# Patient Record
Sex: Male | Born: 1975 | Race: White | Hispanic: No | Marital: Married | Smoking: Former smoker
Health system: Southern US, Community
[De-identification: ages and names within clinical notes are randomized; demographics above are authoritative.]

## PROBLEM LIST (undated history)

## (undated) DIAGNOSIS — K2970 Gastritis, unspecified, without bleeding: Secondary | ICD-10-CM

## (undated) DIAGNOSIS — F419 Anxiety disorder, unspecified: Secondary | ICD-10-CM

## (undated) DIAGNOSIS — K3184 Gastroparesis: Secondary | ICD-10-CM

## (undated) DIAGNOSIS — B9681 Helicobacter pylori [H. pylori] as the cause of diseases classified elsewhere: Secondary | ICD-10-CM

## (undated) DIAGNOSIS — M1990 Unspecified osteoarthritis, unspecified site: Secondary | ICD-10-CM

## (undated) DIAGNOSIS — G43909 Migraine, unspecified, not intractable, without status migrainosus: Secondary | ICD-10-CM

## (undated) DIAGNOSIS — T7840XA Allergy, unspecified, initial encounter: Secondary | ICD-10-CM

## (undated) DIAGNOSIS — F952 Tourette's disorder: Secondary | ICD-10-CM

## (undated) DIAGNOSIS — K648 Other hemorrhoids: Secondary | ICD-10-CM

## (undated) HISTORY — DX: Migraine, unspecified, not intractable, without status migrainosus: G43.909

## (undated) HISTORY — DX: Tourette's disorder: F95.2

## (undated) HISTORY — DX: Helicobacter pylori (H. pylori) as the cause of diseases classified elsewhere: B96.81

## (undated) HISTORY — DX: Other hemorrhoids: K64.8

## (undated) HISTORY — DX: Gastritis, unspecified, without bleeding: K29.70

## (undated) HISTORY — DX: Allergy, unspecified, initial encounter: T78.40XA

## (undated) HISTORY — DX: Unspecified osteoarthritis, unspecified site: M19.90

## (undated) HISTORY — DX: Anxiety disorder, unspecified: F41.9

---

## 2009-11-03 HISTORY — PX: KNEE ARTHROSCOPY: SUR90

## 2010-11-25 ENCOUNTER — Ambulatory Visit
Admission: RE | Admit: 2010-11-25 | Discharge: 2010-11-25 | Payer: Self-pay | Source: Home / Self Care | Attending: Specialist | Admitting: Specialist

## 2010-11-26 LAB — POCT HEMOGLOBIN-HEMACUE: Hemoglobin: 14.7 g/dL (ref 13.0–17.0)

## 2010-11-26 NOTE — Op Note (Signed)
Shane Curry, Shane Curry              ACCOUNT NO.:  [id number]  MEDICAL RECORD NO.:  [id number]          PATIENT TYPE:  AMB  LOCATION:  [facility]                         FACILITY:  [facility]  PHYSICIAN:  Jene Every, M.D.    DATE OF BIRTH:  08-28-1976  DATE OF PROCEDURE: DATE OF DISCHARGE:                              OPERATIVE REPORT   PREOPERATIVE DIAGNOSES:  Medial meniscus tear of the left knee, degenerative joint disease, Baker's cyst.  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  PROCEDURE PERFORMED: 1. Left knee arthroscopy. 2. Partial medial meniscectomy. 3. Chondroplasty of the medial femoral condyle. 4. Drainage of Baker's cyst.  ANESTHESIA:  General.  ASSISTANT:  No assistant.  BRIEF HISTORY:  35 year old, locking, popping, giving way, left knee secondary to repetitive squatting.  MRI indicated meniscus tear and Baker cyst, indicated for partial meniscectomy.  He is also wondering whether the cyst can be drained.  I indicated typically when the pathology is addressed, the Baker cyst would resolve, but we would probe the area of the back of the meniscus in an effort to drain any fluid from the communication.  Risks and benefits were discussed, including bleeding, infection, damage to surrounding structures, no changes in symptoms, worsening symptoms, need for repeat debridement, DVT, PE, anesthetic complications, etc.  TECHNIQUE:  The patient was placed in supine position.  After adequate general anesthesia and 1 gram of vancomycin, the left lower extremity was prepped and draped in the usual sterile fashion.  A lateral parapatellar portal and a superomedial parapatellar portal were fashioned with a #11 blade.  Ingress cannula was atraumatically placed. Irrigant was utilized to insufflate the joint.  Under direct visualization, a medial parapatellar  portal was fashioned with a #11 blade after localization with an 18 gauge needle, sparing the medial meniscus.  There was extensive tearing of the posterior third of the medial meniscus.  It was unstable to probe palpation.  There were grade 3 changes of the medial femoral condyle posteriorly and toward the notch.  A basket rongeur was introduced to form a partial medial meniscectomy to a stable base and further contoured with 3.5 Cuda shaver from both portals.  Remnant of the meniscus was stable to probe palpation.  Therefore, I took a neural probe beneath the meniscus right where the tear was back into the posterior capsule and probed that and palpated the posterior fossa and expressed fluid out through that region.  Next, we viewed the intercondylar notch, normal ACL and PCL.  The lateral compartment revealed normal femoral condyle, tibial plateau, and meniscus stable to probe palpation.  The suprapatellar pouch revealed normal patellofemoral tracking. Gutters were unremarkable.  No chondromalacia.  I reexamined the medial compartment.  No further pathology amenable to arthroscopic intervention; therefore, all instrumentation was removed. Portals were closed with 4-0 nylon simple sutures.  The joint was infiltrated with 0.25% Marcaine with epinephrine.  The  wound was dressed sterilely, awoken without difficulty and transported to the recovery room in satisfactory condition.  The patient tolerated the procedure well with no  complication and minimal blood loss.     Jene Every, M.D.     Cordelia Pen  D:  11/25/2010  T:  11/25/2010  Job:  [id number]  Electronically Signed by Jene Every M.D. on 11/26/2010 12:18:06 PM

## 2012-08-06 ENCOUNTER — Ambulatory Visit (INDEPENDENT_AMBULATORY_CARE_PROVIDER_SITE_OTHER): Payer: BC Managed Care – PPO | Admitting: Internal Medicine

## 2012-08-06 ENCOUNTER — Ambulatory Visit
Admission: RE | Admit: 2012-08-06 | Discharge: 2012-08-06 | Disposition: A | Discharge status: Home / Self Care | Payer: BC Managed Care – PPO | Source: Ambulatory Visit | Attending: Internal Medicine | Admitting: Internal Medicine

## 2012-08-06 ENCOUNTER — Ambulatory Visit: Payer: BC Managed Care – PPO

## 2012-08-06 VITALS — BP 136/103 | HR 67 | Temp 98.0°F | Resp 18 | Ht 69.5 in | Wt 151.0 lb

## 2012-08-06 DIAGNOSIS — F952 Tourette's disorder: Secondary | ICD-10-CM | POA: Insufficient documentation

## 2012-08-06 DIAGNOSIS — K5900 Constipation, unspecified: Secondary | ICD-10-CM

## 2012-08-06 DIAGNOSIS — R1011 Right upper quadrant pain: Secondary | ICD-10-CM

## 2012-08-06 DIAGNOSIS — R630 Anorexia: Secondary | ICD-10-CM

## 2012-08-06 DIAGNOSIS — G8929 Other chronic pain: Secondary | ICD-10-CM

## 2012-08-06 DIAGNOSIS — R109 Unspecified abdominal pain: Secondary | ICD-10-CM

## 2012-08-06 LAB — POCT CBC
Hemoglobin: 16.2 g/dL (ref 14.1–18.1)
MCH, POC: 28.8 pg (ref 27–31.2)
MPV: 11.6 fL (ref 0–99.8)
POC MID %: 5.8 %M (ref 0–12)
RBC: 5.62 M/uL (ref 4.69–6.13)
WBC: 7.5 10*3/uL (ref 4.6–10.2)

## 2012-08-06 LAB — POCT URINALYSIS DIPSTICK
Ketones, UA: NEGATIVE
Leukocytes, UA: NEGATIVE
Nitrite, UA: NEGATIVE
Protein, UA: NEGATIVE
Urobilinogen, UA: 0.2

## 2012-08-06 LAB — COMPREHENSIVE METABOLIC PANEL
ALT: 12 U/L (ref 0–53)
CO2: 25 mEq/L (ref 19–32)
Calcium: 9.8 mg/dL (ref 8.4–10.5)
Chloride: 106 mEq/L (ref 96–112)
Potassium: 5.4 mEq/L — ABNORMAL HIGH (ref 3.5–5.3)
Sodium: 142 mEq/L (ref 135–145)
Total Protein: 7.7 g/dL (ref 6.0–8.3)

## 2012-08-06 LAB — TSH: TSH: 1.654 u[IU]/mL (ref 0.350–4.500)

## 2012-08-06 LAB — POCT UA - MICROSCOPIC ONLY
Mucus, UA: POSITIVE
Yeast, UA: NEGATIVE

## 2012-08-06 MED ORDER — IOHEXOL 300 MG/ML  SOLN
100.0000 mL | Freq: Once | INTRAMUSCULAR | Status: AC | PRN
  Administered 2012-08-06: 100 mL via INTRAVENOUS

## 2012-08-06 NOTE — Progress Notes (Signed)
  Subjective:    Patient ID: Shane Curry, male    DOB: Nov 10, 1975, 36 y.o.   MRN: [id number]  HPI Has chronic constipation, lately a change occurs with decreased appetitie,  Nausea and increased bloating. Used mom and had diarrhea/loose stools for days. Is on 2mg /day of alprazolam for years prescribed by neurologist 10 years  Ago for Tourretes After trying many powerful cns meds. He knows he is dependent on alprazolam. No weight loss. Has nite sweats and chills with no fever. Has felt bad GI wise over 2 yrs.   Review of Systems See above     Objective:   Physical Exam   Results for orders placed in visit on 08/06/12  POCT CBC      Component Value Range   WBC 7.5  4.6 - 10.2 K/uL   Lymph, poc 1.8  0.6 - 3.4   POC LYMPH PERCENT 24.3  10 - 50 %L   MID (cbc) 0.4  0 - 0.9   POC MID % 5.8  0 - 12 %M   POC Granulocyte 5.2  2 - 6.9   Granulocyte percent 69.9  37 - 80 %G   RBC 5.62  4.69 - 6.13 M/uL   Hemoglobin 16.2  14.1 - 18.1 g/dL   HCT, POC 78.2  95.6 - 53.7 %   MCV 90.2  80 - 97 fL   MCH, POC 28.8  27 - 31.2 pg   MCHC 32.0  31.8 - 35.4 g/dL   RDW, POC 21.3     Platelet Count, POC 231  142 - 424 K/uL   MPV 11.6  0 - 99.8 fL  IFOBT (OCCULT BLOOD)      Component Value Range   IFOBT Positive    POCT URINALYSIS DIPSTICK      Component Value Range   Color, UA dark yellow     Clarity, UA clear     Glucose, UA negative     Bilirubin, UA negative     Ketones, UA negative     Spec Grav, UA >=1.030     Blood, UA negative     pH, UA 5.0     Protein, UA negative     Urobilinogen, UA 0.2     Nitrite, UA negative     Leukocytes, UA Negative    POCT UA - MICROSCOPIC ONLY      Component Value Range   WBC, Ur, HPF, POC 1-2     RBC, urine, microscopic 1-5     Bacteria, U Microscopic negative     Mucus, UA positive     Epithelial cells, urine per micros 0-2     Crystals, Ur, HPF, POC negative     Casts, Ur, LPF, POC negative     Yeast, UA negative     [facility] reading  (PRIMARY) by  Dr.Guest no obstruction, no free air        Assessment & Plan:  Abdominal pain Hematuria, hem pos stool Chronic constipation RF for ct abdom with contrast Then refer to GI Anorexia Night sweats

## 2012-08-06 NOTE — Patient Instructions (Signed)

## 2012-08-08 ENCOUNTER — Encounter: Payer: Self-pay | Admitting: Internal Medicine

## 2012-08-08 ENCOUNTER — Ambulatory Visit (INDEPENDENT_AMBULATORY_CARE_PROVIDER_SITE_OTHER): Payer: BC Managed Care – PPO | Admitting: Internal Medicine

## 2012-08-08 VITALS — BP 136/86 | HR 46 | Temp 97.7°F | Resp 16

## 2012-08-08 DIAGNOSIS — R935 Abnormal findings on diagnostic imaging of other abdominal regions, including retroperitoneum: Secondary | ICD-10-CM

## 2012-08-08 DIAGNOSIS — G8929 Other chronic pain: Secondary | ICD-10-CM

## 2012-08-08 DIAGNOSIS — R1013 Epigastric pain: Secondary | ICD-10-CM

## 2012-08-08 DIAGNOSIS — R630 Anorexia: Secondary | ICD-10-CM

## 2012-08-08 DIAGNOSIS — K5900 Constipation, unspecified: Secondary | ICD-10-CM

## 2012-08-08 NOTE — Progress Notes (Signed)
  Subjective:    Patient ID: Shane Curry, male    DOB: Mar 14, 1976, 36 y.o.   MRN: [id number]  HPI CT abdomen/pelvis abnormal with mucosal edema suggestive of a form of colitis. All labs reviewed   Review of Systems     Objective:   Physical Exam        Assessment & Plan:  Will refer to GI in next few days

## 2012-08-09 ENCOUNTER — Encounter: Payer: Self-pay | Admitting: Internal Medicine

## 2012-08-10 ENCOUNTER — Telehealth: Payer: Self-pay | Admitting: Internal Medicine

## 2012-08-10 NOTE — Telephone Encounter (Signed)
Spoke with patient and moved OV to 08/13/12 at 2:45 PM.

## 2012-08-11 ENCOUNTER — Encounter: Payer: Self-pay | Admitting: Internal Medicine

## 2012-08-13 ENCOUNTER — Ambulatory Visit (INDEPENDENT_AMBULATORY_CARE_PROVIDER_SITE_OTHER): Payer: BC Managed Care – PPO | Admitting: Internal Medicine

## 2012-08-13 ENCOUNTER — Encounter: Payer: Self-pay | Admitting: Internal Medicine

## 2012-08-13 VITALS — BP 124/90 | HR 72 | Ht 69.5 in | Wt 151.8 lb

## 2012-08-13 DIAGNOSIS — K5900 Constipation, unspecified: Secondary | ICD-10-CM

## 2012-08-13 DIAGNOSIS — R933 Abnormal findings on diagnostic imaging of other parts of digestive tract: Secondary | ICD-10-CM

## 2012-08-13 DIAGNOSIS — R109 Unspecified abdominal pain: Secondary | ICD-10-CM

## 2012-08-13 MED ORDER — PEG-KCL-NACL-NASULF-NA ASC-C 100 G PO SOLR: 1.0000 | Freq: Once | ORAL | Status: DC

## 2012-08-13 MED ORDER — OXYCODONE-ACETAMINOPHEN 7.5-325 MG PO TABS: 1.0000 | ORAL_TABLET | Freq: Four times a day (QID) | ORAL | Status: DC | PRN

## 2012-08-13 MED ORDER — ONDANSETRON 4 MG PO TBDP: 4.0000 mg | ORAL_TABLET | Freq: Three times a day (TID) | ORAL | Status: DC | PRN

## 2012-08-13 NOTE — Patient Instructions (Addendum)
You have been scheduled for a colonoscopy with propofol. Please follow written instructions given to you at your visit today.  Please pick up your prep kit at the pharmacy within the next 1-3 days. If you use inhalers (even only as needed), please bring them with you on the day of your procedure.   We have sent the following medications to your pharmacy for you to pick up at your convenience: Percocet, zofran moviprep  Differential diagnosis: colon ischemia,  Or IBD  Take Miralax 2-4 times daily until you start to prep for your procedure.

## 2012-08-13 NOTE — Progress Notes (Signed)
Patient ID: Shane Curry, male   DOB: [birth year], 36 y.o.   MRN: [id number]  SUBJECTIVE: HPI Shane Curry is a 36 year old male with a past medical history of Tourette's syndrome who seen in consultation at the request of Dr. Perrin Maltese for evaluation of abnormal GI imaging, abdominal pain with constipation.  The patient is alone today.  The patient reports an over two-year history of chronic constipation, but 3-4 weeks of mild right-sided abdominal pain.  This has also been associated with severe constipation, poor appetite, and poor sleep. This prompted him to seek evaluation about 6 days ago for pain after his abdominal pain acutely worsened.  He now describes middle and right sided abdominal pain which is severe, rated 7-8/10 at worst.  He thought initially this was due to constipation and about 8 days ago took a dose of milk of magnesia and after this had one to 2 days of very loose stool. No significant bowel movement since then. He did start MiraLAX about 4 days ago 17 g daily and yesterday and today had a small, thin brown stool.  He does report occasional blood in his stool over the last 2 years. He describes this as bright red and worse when his stools are difficult to pass. He does occasionally feel perianal burning with passage of stool. He denies fevers, but endorses chills and at times diaphoresis when the pain is severe. His weight has been stable. He denies heartburn, but has had nausea associated with this pain and constipation. He's also vomited on multiple occasions over the last several weeks. He denies hematemesis. He's been out of work for at least a week do to the symptoms. He remains very concerned about not working and be able to support his family.  Review of Systems  As per history of present illness, otherwise negative   Past Medical History  Diagnosis Date    Tourette's syndrome    . Anxiety     Current Outpatient Prescriptions  Medication Sig Dispense Refill  . alprazolam  (XANAX) 2 MG tablet Take 2 mg by mouth at bedtime as needed.      . triazolam (HALCION) 0.25 MG tablet Take 0.25 mg by mouth at bedtime as needed.      . ondansetron (ZOFRAN ODT) 4 MG disintegrating tablet Take 1 tablet (4 mg total) by mouth every 8 (eight) hours as needed for nausea.  20 tablet  0  . oxyCODONE-acetaminophen (PERCOCET) 7.5-325 MG per tablet Take 1 tablet by mouth every 6 (six) hours as needed for pain.  60 tablet  0  . peg 3350 powder (MOVIPREP) 100 G SOLR Take 1 kit (100 g total) by mouth once.  1 kit  0    Allergies  Allergen Reactions  . Penicillins     Family History  Problem Relation Age of Onset  . Diabetes Father   . Diabetes Paternal Grandfather   . Diabetes Maternal Grandfather     History  Substance Use Topics  . Smoking status: Former Games developer  . Smokeless tobacco: Never Used  . Alcohol Use: No     1 drink yearly    OBJECTIVE: BP 124/90  Pulse 72  Ht 5' 9.5" (1.765 m)  Wt 151 lb 12.8 oz (68.856 kg)  BMI 22.10 kg/m2 Constitutional: Well-developed and well-nourished. No distress. HEENT: Normocephalic and atraumatic. Oropharynx is clear and moist. No oropharyngeal exudate. Conjunctivae are normal. No scleral icterus. Neck: Neck supple. Trachea midline. Cardiovascular: Normal rate, regular rhythm and intact distal pulses.  No M/R/G Pulmonary/chest: Effort normal and breath sounds normal. No wheezing, rales or rhonchi. Abdominal: Soft, moderate tenderness mid and right abdomen and lower abdomen with mild voluntary guarding but no rebound, nondistended. Bowel sounds active throughout. There are no masses palpable. No hepatosplenomegaly. Extremities: no clubbing, cyanosis, or edema Lymphadenopathy: No cervical adenopathy noted. Neurological: Alert and oriented to person place and time. Skin: Skin is warm and dry. No rashes noted. Psychiatric: Normal mood and affect. Behavior is normal.  Labs and Imaging -- CBC    Component Value Date/Time   WBC 7.5  08/06/2012 1056   RBC 5.62 08/06/2012 1056   HGB 16.2 08/06/2012 1056   HGB 14.7 11/25/2010 1138   HCT 50.7 08/06/2012 1056   MCV 90.2 08/06/2012 1056   MCH 28.8 08/06/2012 1056   MCHC 32.0 08/06/2012 1056    CMP     Component Value Date/Time   NA 142 08/06/2012 1046   K 5.4* 08/06/2012 1046   CL 106 08/06/2012 1046   CO2 25 08/06/2012 1046   GLUCOSE 104* 08/06/2012 1046   BUN 16 08/06/2012 1046   CREATININE 1.03 08/06/2012 1046   CALCIUM 9.8 08/06/2012 1046   PROT 7.7 08/06/2012 1046   ALBUMIN 5.0 08/06/2012 1046   AST 13 08/06/2012 1046   ALT 12 08/06/2012 1046   ALKPHOS 48 08/06/2012 1046   BILITOT 0.9 08/06/2012 1046   CT performed 08/06/2012  Clinical Data: Right upper quadrant pain, worse over last 2 weeks, some nausea and vomiting, chronic constipation   CT ABDOMEN AND PELVIS WITH CONTRAST   Technique:  Multidetector CT imaging of the abdomen and pelvis was performed following the standard protocol during bolus administration of intravenous contrast.   Contrast: OMNIPAQUE IOHEXOL 300 MG/ML  SOLN   Comparison: None.   Findings: The lung bases are clear.  The liver enhances with no focal abnormality and no ductal dilatation is seen.  No calcified gallstones are noted.  The pancreas is normal in size and the pancreatic duct is not dilated.  The adrenal glands and spleen are unremarkable.  The stomach is moderately fluid distended with no significant abnormality noted.  The kidneys enhance with no calculus or mass and no hydronephrosis is seen.  The abdominal aorta is normal in caliber.  The mesenteric vasculature is patent. No adenopathy is seen.   The urinary bladder is moderately distended with no abnormality noted.  The prostate is normal in size.   However, there is apparent mucosal edema of bowel in the right abdomen.  This appears to represent the proximal transverse colon and hepatic flexure of colon and is worrisome for focal colitis. The terminal ileum is well  visualized and opacifies with contrast, with no abnormality noted.  The appendix fills with air normally. No bony abnormality is seen.   IMPRESSION:   1.  Although the colon is not well distended there is suspicion of mucosal edema involving what appears to be the proximal transverse colon and hepatic flexure of colon.  This is most consistent with focal colitis.  Clinical correlation is recommended. 2.  The appendix and terminal ileum are unremarkable.    ASSESSMENT AND PLAN: 36 year old male with a past medical history of Tourette's syndrome who seen in consultation at the request of Dr. Perrin Maltese for evaluation of abnormal GI imaging, abdominal pain with constipation.   1.  Severe abdominal pain/abnormal GI imaging/constipation -- the patient's symptoms are moderate to severe, but at this point he does not appear toxic and I  do not think he needs an admission. He is also adamant that he does not want to be in hospital right now. We discussed the differential which includes ischemic, inflammatory such as Crohn's disease, and less likely infectious or malignant.  I think at this point he needs colonoscopy to examine the colon directly, particularly at the hepatic flexure and proximal transverse colon (the area inflamed at CT).  We will try to fit him in for colonoscopy next week, but in the interim I would like him to increase the dose of MiraLAX. He will take an extra dose today and then take 2-4 doses on a daily basis up until he begins his colonoscopy prep. I've given him oxycodone/Tylenol 1-2 tablets every 6 hours when necessary pain along with ondansetron to be used as needed and as directed for nausea. I've also recommended a low residue diet until we have figured out what is going on. He is instructed to call us immediately if he develops worsening pain, fever, severe nausea and vomiting, bloody stools and he voices understanding. He is given my business card and number to call should he need  it.   --He is asked for paperwork to be filled out for short-term disability which I think is appropriate given his current situation. Medical records will start this process and I will sign the paperwork when it is available for me.

## 2012-08-16 ENCOUNTER — Encounter: Payer: Self-pay | Admitting: Internal Medicine

## 2012-08-18 ENCOUNTER — Encounter: Payer: Self-pay | Admitting: Internal Medicine

## 2012-08-18 ENCOUNTER — Ambulatory Visit (AMBULATORY_SURGERY_CENTER): Payer: BC Managed Care – PPO | Admitting: Internal Medicine

## 2012-08-18 VITALS — BP 129/75 | HR 77 | Temp 97.1°F | Resp 19 | Ht 69.5 in | Wt 151.0 lb

## 2012-08-18 DIAGNOSIS — R109 Unspecified abdominal pain: Secondary | ICD-10-CM

## 2012-08-18 DIAGNOSIS — R933 Abnormal findings on diagnostic imaging of other parts of digestive tract: Secondary | ICD-10-CM

## 2012-08-18 DIAGNOSIS — K5900 Constipation, unspecified: Secondary | ICD-10-CM

## 2012-08-18 MED ORDER — HYOSCYAMINE SULFATE 0.125 MG SL SUBL: 0.1250 mg | SUBLINGUAL_TABLET | SUBLINGUAL | Status: DC | PRN

## 2012-08-18 MED ORDER — SODIUM CHLORIDE 0.9 % IV SOLN: 500.0000 mL | INTRAVENOUS | Status: DC

## 2012-08-18 MED ORDER — LINACLOTIDE 290 MCG PO CAPS: 290.0000 ug | ORAL_CAPSULE | Freq: Every day | ORAL | Status: DC

## 2012-08-18 NOTE — Progress Notes (Signed)
Patient did not experience any of the following events: a burn prior to discharge; a fall within the facility; wrong site/side/patient/procedure/implant event; or a hospital transfer or hospital admission upon discharge from the facility. (G8907) Patient did not have preoperative order for IV antibiotic SSI prophylaxis. (G8918)  

## 2012-08-18 NOTE — Op Note (Signed)
[facility] [address] [place], [address]   COLONOSCOPY PROCEDURE REPORT  PATIENT: Shane Curry, Shane Curry  MR#: [id number] BIRTHDATE: Feb 16, 1976 , 36  yrs. old GENDER: Male ENDOSCOPIST: Beverley Fiedler, MD REFERRED ON:GEXBM Guest, M.D. PROCEDURE DATE:  08/18/2012 PROCEDURE:   Colonoscopy, diagnostic ASA CLASS:   Class I INDICATIONS:an abnormal CT, constipation, abdominal pain in right abdomen. MEDICATIONS: MAC sedation, administered by CRNA and propofol (Diprivan) 400mg  IV  DESCRIPTION OF PROCEDURE:   After the risks benefits and alternatives of the procedure were thoroughly explained, informed consent was obtained.  A digital rectal exam revealed no rectal mass.   The LB CF-[id number]  endoscope was introduced through the anus and advanced to the terminal ileum which was intubated for a short distance. No adverse events experienced.   The quality of the prep was poor, using MoviPrep  The instrument was then slowly withdrawn as the colon was fully examined.      COLON FINDINGS: The mucosa appeared normal in the terminal ileum. A significant amount of liquid stool was present throughout the entire examined colon.  Copious Irrigation and lavage was performed using a large amount of water.  Partial clearance with improved visualization was achieved, adequate to detect inflammation.   The colonic mucosa appeared normal throughout the entire examined colon.  Retroflexed views revealed no abnormalities. The time to cecum=4 minutes 05 seconds.  Withdrawal time=14 minutes 11 seconds. The scope was withdrawn and the procedure completed.    COMPLICATIONS: There were no complications.  ENDOSCOPIC IMPRESSION: 1.   Normal mucosa in the terminal ileum 2.   Significant amount of stool was present throughout the entire examined colon, irrigation and lavage performed 3.   The colonic mucosa appeared normal throughout the entire examined colon  RECOMMENDATIONS: 1.   Advance diet as tolerated. 2.  Trial of Linzess 290 mcg once daily for constipation 3.  If pain fails to improve, likely will repeat CT scan abd/pelvis 4.  Return to office in about 2 weeks   eSigned:  Beverley Fiedler, MD 08/18/2012 8:38 AM   cc: Robert Bellow, MD and The Patient   PATIENT NAME:  Shane Curry, Shane Curry MR#: [id number]

## 2012-08-18 NOTE — Patient Instructions (Addendum)
Advance your diet as tolerated. Trial of Linzess 290 mcg once daily for constipation. Return to Dr. Lauro Franklin office in 2 weeks, call for appointment, [phone].    YOU HAD AN ENDOSCOPIC PROCEDURE TODAY AT THE [facility]: Refer to the procedure report that was given to you for any specific questions about what was found during the examination.  If the procedure report does not answer your questions, please call your gastroenterologist to clarify.  If you requested that your care partner not be given the details of your procedure findings, then the procedure report has been included in a sealed envelope for you to review at your convenience later.  YOU SHOULD EXPECT: Some feelings of bloating in the abdomen. Passage of more gas than usual.  Walking can help get rid of the air that was put into your GI tract during the procedure and reduce the bloating. If you had a lower endoscopy (such as a colonoscopy or flexible sigmoidoscopy) you may notice spotting of blood in your stool or on the toilet paper. If you underwent a bowel prep for your procedure, then you may not have a normal bowel movement for a few days.  DIET: Your first meal following the procedure should be a light meal and then it is ok to progress to your normal diet.  A half-sandwich or bowl of soup is an example of a good first meal.  Heavy or fried foods are harder to digest and may make you feel nauseous or bloated.  Likewise meals heavy in dairy and vegetables can cause extra gas to form and this can also increase the bloating.  Drink plenty of fluids but you should avoid alcoholic beverages for 24 hours.  ACTIVITY: Your care partner should take you home directly after the procedure.  You should plan to take it easy, moving slowly for the rest of the day.  You can resume normal activity the day after the procedure however you should NOT DRIVE or use heavy machinery for 24 hours (because of the sedation medicines used during  the test).    SYMPTOMS TO REPORT IMMEDIATELY: A gastroenterologist can be reached at any hour.  During normal business hours, 8:30 AM to 5:00 PM Monday through Friday, call [phone].  After hours and on weekends, please call the GI answering service at [phone] who will take a message and have the physician on call contact you.   Following lower endoscopy (colonoscopy or flexible sigmoidoscopy):  Excessive amounts of blood in the stool  Significant tenderness or worsening of abdominal pains  Swelling of the abdomen that is new, acute  Fever of 100F or higher  FOLLOW UP: If any biopsies were taken you will be contacted by phone or by letter within the next 1-3 weeks.  Call your gastroenterologist if you have not heard about the biopsies in 3 weeks.  Our staff will call the home number listed on your records the next business day following your procedure to check on you and address any questions or concerns that you may have at that time regarding the information given to you following your procedure. This is a courtesy call and so if there is no answer at the home number and we have not heard from you through the emergency physician on call, we will assume that you have returned to your regular daily activities without incident.  SIGNATURES/CONFIDENTIALITY: You and/or your care partner have signed paperwork which will be entered into your electronic medical record.  These  signatures attest to the fact that that the information above on your After Visit Summary has been reviewed and is understood.  Full responsibility of the confidentiality of this discharge information lies with you and/or your care-partner.

## 2012-08-19 ENCOUNTER — Telehealth: Payer: Self-pay | Admitting: *Deleted

## 2012-08-19 NOTE — Telephone Encounter (Signed)
Left message that called for f/u 

## 2012-08-24 ENCOUNTER — Telehealth: Payer: Self-pay | Admitting: *Deleted

## 2012-08-24 ENCOUNTER — Ambulatory Visit: Payer: BC Managed Care – PPO | Admitting: Internal Medicine

## 2012-08-24 DIAGNOSIS — R109 Unspecified abdominal pain: Secondary | ICD-10-CM

## 2012-08-24 DIAGNOSIS — R935 Abnormal findings on diagnostic imaging of other abdominal regions, including retroperitoneum: Secondary | ICD-10-CM

## 2012-08-24 NOTE — Telephone Encounter (Signed)
Called pt for an update and to schedule him for a f/u which he made this am; I had made one for 09/02/12. Pt reports the same problems as mentioned in the 1st OV; constipation, bloating, cramping and his abdomen gurgling. He states the gurgling keeps him from sleeping. After the prep and the procedure, pt reports one formed stool that night. Since then, 08/18/12, he has taken Linzess 290 mcg every am; the only results have been a small amount of liquid. Yesterday, he took [employer] or MOM - I'm not sure- and had what he calls good output and he thinks he got cleaned out. His appetite remains decreased, eating 1/2 of what he normally would eat. He is trying to hydrate himself with water, white grape juice and Coke. This am, he took 2 Linzess and as of now, no BM.  Pt has an appt on Friday, 08/27/12 and I offered him 4pm today or an appt with Mike Gip, PA, but he can't get off work an other day and he only wants to see Dr Rhea Belton. Please advise of any orders or recommendations. Thanks.

## 2012-08-24 NOTE — Telephone Encounter (Signed)
Given persistent pain, history of abnormal imaging, I would repeat the CT scan of the abdomen and pelvis with contrast, indication persistent right-sided abdominal pain Also please repeat labs CBC, CMP, lipase, amylase, ESR

## 2012-08-25 ENCOUNTER — Other Ambulatory Visit (INDEPENDENT_AMBULATORY_CARE_PROVIDER_SITE_OTHER): Payer: BC Managed Care – PPO

## 2012-08-25 ENCOUNTER — Encounter: Payer: Self-pay | Admitting: Internal Medicine

## 2012-08-25 DIAGNOSIS — R109 Unspecified abdominal pain: Secondary | ICD-10-CM

## 2012-08-25 DIAGNOSIS — R935 Abnormal findings on diagnostic imaging of other abdominal regions, including retroperitoneum: Secondary | ICD-10-CM

## 2012-08-25 LAB — CBC WITH DIFFERENTIAL/PLATELET
Basophils Absolute: 0 10*3/uL (ref 0.0–0.1)
Eosinophils Absolute: 0.1 10*3/uL (ref 0.0–0.7)
Lymphocytes Relative: 22.4 % (ref 12.0–46.0)
MCHC: 32.9 g/dL (ref 30.0–36.0)
MCV: 88.3 fl (ref 78.0–100.0)
Monocytes Absolute: 0.5 10*3/uL (ref 0.1–1.0)
Neutro Abs: 3.5 10*3/uL (ref 1.4–7.7)
Neutrophils Relative %: 66.9 % (ref 43.0–77.0)
RDW: 12.8 % (ref 11.5–14.6)

## 2012-08-25 LAB — COMPREHENSIVE METABOLIC PANEL
ALT: 18 U/L (ref 0–53)
AST: 16 U/L (ref 0–37)
Alkaline Phosphatase: 42 U/L (ref 39–117)
Calcium: 9 mg/dL (ref 8.4–10.5)
Chloride: 105 mEq/L (ref 96–112)
Creatinine, Ser: 1 mg/dL (ref 0.4–1.5)
Potassium: 4.1 mEq/L (ref 3.5–5.1)

## 2012-08-25 LAB — LIPASE: Lipase: 42 U/L (ref 11.0–59.0)

## 2012-08-25 LAB — AMYLASE: Amylase: 106 U/L (ref 27–131)

## 2012-08-25 NOTE — Telephone Encounter (Signed)
Informed pt Dr Rhea Belton would like for him to have labs and repeat his CT Scan. He will go in the am for his CT scan and hopefully get labs today or tomorrow.

## 2012-08-26 ENCOUNTER — Ambulatory Visit (INDEPENDENT_AMBULATORY_CARE_PROVIDER_SITE_OTHER)
Admission: RE | Admit: 2012-08-26 | Discharge: 2012-08-26 | Disposition: A | Discharge status: Home / Self Care | Payer: BC Managed Care – PPO | Source: Ambulatory Visit | Attending: Internal Medicine | Admitting: Internal Medicine

## 2012-08-26 DIAGNOSIS — R935 Abnormal findings on diagnostic imaging of other abdominal regions, including retroperitoneum: Secondary | ICD-10-CM

## 2012-08-26 DIAGNOSIS — R109 Unspecified abdominal pain: Secondary | ICD-10-CM

## 2012-08-26 MED ORDER — IOHEXOL 300 MG/ML  SOLN
100.0000 mL | Freq: Once | INTRAMUSCULAR | Status: AC | PRN
  Administered 2012-08-26: 100 mL via INTRAVENOUS

## 2012-08-27 ENCOUNTER — Encounter: Payer: Self-pay | Admitting: Internal Medicine

## 2012-08-27 ENCOUNTER — Ambulatory Visit (INDEPENDENT_AMBULATORY_CARE_PROVIDER_SITE_OTHER): Payer: BC Managed Care – PPO | Admitting: Internal Medicine

## 2012-08-27 ENCOUNTER — Other Ambulatory Visit: Payer: Self-pay | Admitting: *Deleted

## 2012-08-27 VITALS — BP 124/80 | HR 68 | Ht 69.5 in | Wt 152.2 lb

## 2012-08-27 DIAGNOSIS — K5909 Other constipation: Secondary | ICD-10-CM

## 2012-08-27 DIAGNOSIS — R109 Unspecified abdominal pain: Secondary | ICD-10-CM

## 2012-08-27 DIAGNOSIS — R141 Gas pain: Secondary | ICD-10-CM

## 2012-08-27 DIAGNOSIS — K5900 Constipation, unspecified: Secondary | ICD-10-CM

## 2012-08-27 DIAGNOSIS — R140 Abdominal distension (gaseous): Secondary | ICD-10-CM

## 2012-08-27 MED ORDER — METRONIDAZOLE 250 MG PO TABS: 250.0000 mg | ORAL_TABLET | Freq: Three times a day (TID) | ORAL | Status: DC

## 2012-08-27 MED ORDER — HYOSCYAMINE SULFATE 0.125 MG SL SUBL: 0.1250 mg | SUBLINGUAL_TABLET | SUBLINGUAL | Status: DC | PRN

## 2012-08-27 MED ORDER — ALIGN PO CAPS: 1.0000 | ORAL_CAPSULE | Freq: Every day | ORAL | Status: DC

## 2012-08-27 MED ORDER — LINACLOTIDE 290 MCG PO CAPS: 290.0000 ug | ORAL_CAPSULE | Freq: Every day | ORAL | Status: DC

## 2012-08-27 NOTE — Patient Instructions (Addendum)
We have sent the following medications to your pharmacy for you to pick up at your convenience: Linzess, align, flagyl, please take all medications as directed.

## 2012-08-27 NOTE — Progress Notes (Signed)
Subjective:    Patient ID: Shane Curry, male    DOB: 24-May-1976, 36 y.o.   MRN: [id number]  HPI Shane Curry is a 36 yo male with PMH of Tourette's syndrome who seen in followup for evaluation of abdominal pain constipation.  I met the patient recently after being sent from primary care for evaluation of constipation, abdominal pain, and abnormal CT scan. He underwent colonoscopy which was performed on 08/18/2012. This exam revealed normal terminal ileum and normal colonic mucosa, though the preparation was poor. The mucosa was washed which revealed adequate views to exclude overwhelming inflammation. The patient was then given a prescription for [facility] to be used on a daily basis for constipation and a prescription for Levsin to be used as needed for abdominal pain and spasm. He did not get the latter filled.  He was also given a prescription for Percocet initially at clinic visit, but he threw this away because it wasn't helping and he read it causes constipation.  Since the colonoscopy he has still not had return of normal bowel function. He has had a few spontaneous bowel movements without laxatives but describes the stool as "mush". They are not frankly watery, but have no form. He continues to have abdominal bloating and abdominal pain. Most of the pain as cramping but it can be sharp. This is located primarily in the right abdomen that occasionally in the left as well. It does seem to be positional, because he describes last night when he was lying on the couch he felt well. Overall his pain is better, and he reports this "isn't my main concern". He reports no grade definite with a LINZESS, but a few days ago he took 2 doses of the 290 mcg pills and had watery stool. Overall his nausea is better and he's had no vomiting. No fevers or chills. He has still been unable to work. He has tried a very his diet to see how it impacts his symptoms. He reports having eaten pasta, chicken sandwich, [place]  food, [place] sandwich, a hamburger. He's also varied his fluid intake including Gatorade and coffee.    He does feel stress financially because he's been unable to work. He stated during the encounter that they're having to borrow money from his in-laws. 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  Review of Systems As per history of present illness, otherwise negative  Current Medications, Allergies, Past Medical History, Past Surgical History, Family History and Social History were reviewed in [employer] record.    Objective:   Physical Exam BP 124/80  Pulse 68  Ht 5' 9.5" (1.765 m)  Wt 152 lb 3.2 oz (69.037 kg)  BMI 22.15 kg/m2 Constitutional: Well-developed and well-nourished. No distress. HEENT: Normocephalic and atraumatic. Oropharynx is clear and moist. No oropharyngeal exudate. Conjunctivae are normal.  No scleral icterus. Neck: Neck supple. Trachea midline. Cardiovascular: Normal rate, regular rhythm and intact distal pulses. No M/R/G Pulmonary/chest: Effort normal and breath sounds normal. No wheezing, rales or rhonchi. Abdominal: Soft, nontender, nondistended. Bowel sounds active throughout. There are no masses palpable. No hepatosplenomegaly. Extremities: no clubbing, cyanosis, or edema Lymphadenopathy: No cervical adenopathy noted. Neurological: Alert and oriented to person place and time. Skin: Skin is warm and dry. No rashes noted. Psychiatric: Normal mood and affect. Behavior is normal.  CMP     Component Value Date/Time   NA 139 08/25/2012 1010  K 4.1 08/25/2012 1010   CL 105 08/25/2012 1010   CO2 28 08/25/2012 1010   GLUCOSE 87 08/25/2012 1010   BUN 15 08/25/2012 1010   CREATININE 1.0 08/25/2012 1010   CREATININE 1.03 08/06/2012 1046   CALCIUM 9.0 08/25/2012 1010   PROT 7.4 08/25/2012 1010   ALBUMIN 4.0 08/25/2012 1010   AST 16 08/25/2012 1010    ALT 18 08/25/2012 1010   ALKPHOS 42 08/25/2012 1010   BILITOT 0.9 08/25/2012 1010    CBC    Component Value Date/Time   WBC 5.3 08/25/2012 1010   WBC 7.5 08/06/2012 1056   RBC 4.82 08/25/2012 1010   RBC 5.62 08/06/2012 1056   HGB 14.0 08/25/2012 1010   HGB 16.2 08/06/2012 1056   HCT 42.6 08/25/2012 1010   HCT 50.7 08/06/2012 1056   PLT 157.0 08/25/2012 1010   MCV 88.3 08/25/2012 1010   MCV 90.2 08/06/2012 1056   MCH 28.8 08/06/2012 1056   MCHC 32.9 08/25/2012 1010   MCHC 32.0 08/06/2012 1056   RDW 12.8 08/25/2012 1010   LYMPHSABS 1.2 08/25/2012 1010   MONOABS 0.5 08/25/2012 1010   EOSABS 0.1 08/25/2012 1010   BASOSABS 0.0 08/25/2012 1010    Lipase     Component Value Date/Time   LIPASE 42.0 08/25/2012 1010    Amylase    Component Value Date/Time   AMYLASE 106 08/25/2012 1010    Repeat imaging done 08/25/2012 for persistent pain: Clinical Data: Right upper quadrant pain.  Nausea and vomiting. Chronic constipation.   CT ABDOMEN AND PELVIS WITH CONTRAST   Technique:  Multidetector CT imaging of the abdomen and pelvis was performed following the standard protocol during bolus administration of intravenous contrast.   Contrast: OMNIPAQUE IOHEXOL 300 MG/ML  SOLN   Comparison: August 06, 2012   Findings: The lung bases are clear.  The liver, spleen, pancreas, gallbladder, adrenal glands are normal.  There is a left parapelvic renal cyst.  The kidneys are normal.  The aorta is normal.  There is no abdominal lymphadenopathy.  There is no small bowel obstruction or diverticulitis.  The appendix is normal. The aorta is normal without aneurysmal dilatation.   Images of the pelvis demonstrate fluid filled bladder without gross abnormality.  Pelvic phleboliths are identified.  Images of the bones demonstrate minimal degenerative joint changes at L1-L2 with narrowed joint space and osteophyte formation.   IMPRESSION: No focal acute abnormality identified.  There is  no small bowel obstruction or diverticulitis.  The appendix is normal.  The gallbladder is normal.     Assessment & Plan:  36 yo male with PMH of Tourette's syndrome who seen in followup for evaluation of abdominal pain constipation.  1.  Abd pain/constipation/alerted bowel habits/bloating -- at this point he has had a thorough workup including lab evaluation, cross-sectional imaging, and direct visualization with colonoscopy with intubation of the terminal ileum. There was no evidence of colitis or ileitis, either infectious or inflammatory. Given the reassuring nature of the testing, at this point his symptoms seem primarily functional. I am going to empirically treat him with metronidazole 250 mg 3 times a day for 7 days for bacterial overgrowth. This would also treat an infectious etiology such as Giardia.  I would like him to begin a fiber supplement to help bulk his stools, specifically with Benefiber 1 tablespoon daily. I will also start him on Align as a probiotic, which hopefully will help regulate his stools and help with bloating. He will continue  to use LINZESS 290 mcg once daily only on a more as-needed basis if he feels particularly constipated. We have discussed that I do not feel he needs to have a bowel movement every day, and I do not want him to stress more if he doesn't. I also want him to avoid sitting on the toilet trying to have a stool, it just because he hasn't that day. I've also encouraged that he return to the pharmacy for Levsin prescription to be used as needed for cramping and spasm.  I will see him back in 2 weeks to reassess his symptoms, and I've advised he notify us sooner if he worsens in anyway. He voices understanding.

## 2012-08-30 ENCOUNTER — Telehealth: Payer: Self-pay | Admitting: *Deleted

## 2012-08-30 NOTE — Telephone Encounter (Signed)
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 lmom for pt to call back. We cannot give him any info on pt d/t HIPPA laws.

## 2012-09-02 ENCOUNTER — Ambulatory Visit: Payer: BC Managed Care – PPO | Admitting: Internal Medicine

## 2012-09-03 NOTE — Telephone Encounter (Signed)
Mr Shane Curry never called back.

## 2012-09-06 ENCOUNTER — Telehealth: Payer: Self-pay | Admitting: Internal Medicine

## 2012-09-06 NOTE — Telephone Encounter (Signed)
Pt here in the lobby when I went to place meds at the front desk.  He asked who called from his work and I informed him, Ree Kida. I went to get his disability form from Dr Rhea Belton, made a copy and gave him the original. Informed Shakeem that Mr Vela Prose called on 08/27/12, the same day as his last appt, and left a message to call him; I returned is call and left him a message and have not heard from him since. Pt stated understanding and left.

## 2012-11-18 ENCOUNTER — Ambulatory Visit (INDEPENDENT_AMBULATORY_CARE_PROVIDER_SITE_OTHER): Payer: BC Managed Care – PPO | Admitting: Emergency Medicine

## 2012-11-18 VITALS — BP 151/90 | HR 74 | Temp 98.0°F | Resp 18 | Ht 69.5 in | Wt 157.0 lb

## 2012-11-18 DIAGNOSIS — J209 Acute bronchitis, unspecified: Secondary | ICD-10-CM

## 2012-11-18 DIAGNOSIS — J018 Other acute sinusitis: Secondary | ICD-10-CM

## 2012-11-18 DIAGNOSIS — J111 Influenza due to unidentified influenza virus with other respiratory manifestations: Secondary | ICD-10-CM

## 2012-11-18 DIAGNOSIS — R6889 Other general symptoms and signs: Secondary | ICD-10-CM

## 2012-11-18 LAB — POCT INFLUENZA A/B
Influenza A, POC: NEGATIVE
Influenza B, POC: NEGATIVE

## 2012-11-18 MED ORDER — HYDROCOD POLST-CHLORPHEN POLST 10-8 MG/5ML PO LQCR: 5.0000 mL | Freq: Two times a day (BID) | ORAL | Status: DC | PRN

## 2012-11-18 MED ORDER — PSEUDOEPHEDRINE-GUAIFENESIN ER 60-600 MG PO TB12: 1.0000 | ORAL_TABLET | Freq: Two times a day (BID) | ORAL | Status: AC

## 2012-11-18 MED ORDER — LEVOFLOXACIN 500 MG PO TABS: 500.0000 mg | ORAL_TABLET | Freq: Every day | ORAL | Status: AC

## 2012-11-18 NOTE — Patient Instructions (Addendum)
Infectious Mononucleosis  Infectious mononucleosis (mono) is a common germ (viral) infection in children, teenagers, and young adults.   CAUSES   Mono is an infection caused by the Epstein Barr virus. The virus is spread by close personal contact with someone who has the infection. It can be passed by contact with your saliva through things such as kissing or sharing drinking glasses. Sometimes, the infection can be spread from someone who does not appear sick but still spreads the virus (asymptomatic carrier state).   SYMPTOMS   The most common symptoms of Mono are:   Sore throat.   Headache.   Fatigue.   Muscle aches.   Swollen glands.   Fever.   Poor appetite.   Enlarged liver or spleen.  The less common symptoms can include:   Rash.   Feeling sick to your stomach (nauseous).   Abdominal pain.  DIAGNOSIS   Mono is diagnosed by a blood test.   TREATMENT   Treatment of mono is usually at home. There is no medicine that cures this virus. Sometimes hospital treatment is needed in severe cases. Steroid medicine sometimes is needed if the swelling in the throat causes breathing or swallowing problems.   HOME CARE INSTRUCTIONS    Drink enough fluids to keep your urine clear or pale yellow.   Eat soft foods. Cool foods like popsicles or ice cream can soothe a sore throat.   Only take over-the-counter or prescription medicines for pain, discomfort, or fever as directed by your caregiver. Children under 18 years of age should not take aspirin.   Gargle salt water. This may help relieve your sore throat. Put 1 teaspoon (tsp) of salt in 1 cup of warm water. Sucking on hard candy may also help.   Rest as needed.   Start regular activities gradually after the fever is gone. Be sure to rest when tired.   Avoid strenuous exercise or contact sports until your caregiver says it is okay. The liver and spleen could be seriously injured.   Avoid sharing drinking glasses or kissing until your caregiver tells you  that you are no longer contagious.  SEEK MEDICAL CARE IF:    Your fever is not gone after 7 days.   Your activity level is not back to normal after 2 weeks.   You have yellow coloring to eyes and skin (jaundice).  SEEK IMMEDIATE MEDICAL CARE IF:    You have severe pain in the abdomen or shoulder.   You have trouble swallowing or drooling.   You have trouble breathing.   You develop a stiff neck.   You develop a severe headache.   You cannot stop throwing up (vomiting).   You have convulsions.   You are confused.   You have trouble with balance.   You develop signs of body fluid loss (dehydration):   Weakness.   Sunken eyes.   Pale skin.   Dry mouth.   Rapid breathing or pulse.  MAKE SURE YOU:    Understand these instructions.   Will watch your condition.   Will get help right away if you are not doing well or get worse.  Document Released: 10/17/2000 Document Revised: 01/12/2012 Document Reviewed: 08/15/2008  ExitCare Patient Information 2013 [employer].

## 2012-11-18 NOTE — Progress Notes (Signed)
[facility] [address] [place] [address] [phone]  Date:  11/18/2012   Name:  Shane Curry   DOB:  [birth year]   MRN:  [id number]  PCP:  Tally Due, MD    Chief Complaint: Cough and Sore Throat   History of Present Illness:  Shane Curry is a 37 y.o. very pleasant male patient who presents with the following:  Wife treated with two rounds of antibiotics for strep and has positive tests for mono.  He has had myalgias and arthralgias and yesterday developed sore throat.  No fever or chills.  Some non productive cough no wheezing or shortness of breath.  No nausea or vomiting.  Extensive sick contacts at work.  Patient Active Problem List  Diagnosis  . Chronic abdominal pain  . Tourette's syndrome  . Constipation    Past Medical History  Diagnosis Date  . Anxiety   . Allergy   . Arthritis     Past Surgical History  Procedure Date  . Knee arthroscopy 11/2009    History  Substance Use Topics  . Smoking status: Former Games developer  . Smokeless tobacco: Never Used  . Alcohol Use: Yes     Comment: 1 drink yearly    Family History  Problem Relation Age of Onset  . Diabetes Father   . Diabetes Paternal Grandfather   . Diabetes Maternal Grandfather   . Colon cancer Neg Hx   . Esophageal cancer Neg Hx   . Stomach cancer Neg Hx   . Rectal cancer Neg Hx     Allergies  Allergen Reactions  . Penicillins     unsure    Medication list has been reviewed and updated.  Current Outpatient Prescriptions on File Prior to Visit  Medication Sig Dispense Refill  . alprazolam (XANAX) 2 MG tablet Take 2 mg by mouth at bedtime as needed.      . cetirizine (ZYRTEC) 10 MG tablet Take 10 mg by mouth daily.      Marland Kitchen oxyCODONE-acetaminophen (PERCOCET) 7.5-325 MG per tablet Take 1 tablet by mouth every 6 (six) hours as needed for pain.  60 tablet  0  . triazolam (HALCION) 0.25 MG tablet Take 0.25 mg by mouth at bedtime as needed.      .  bifidobacterium infantis (ALIGN) capsule Take 1 capsule by mouth daily.  14 capsule  0  . hyoscyamine (LEVSIN SL) 0.125 MG SL tablet Place 1-2 tablets (0.125-0.25 mg total) under the tongue as needed for cramping (abdominal spasm/pain).  30 tablet  1  . Linaclotide (LINZESS) 290 MCG CAPS Take 290 mcg by mouth daily.  12 capsule  0  . metroNIDAZOLE (FLAGYL) 250 MG tablet Take 1 tablet (250 mg total) by mouth 3 (three) times daily.  30 tablet  0    Review of Systems:  As per HPI, otherwise negative.    Physical Examination: Filed Vitals:   11/18/12 0919  BP: 151/90  Pulse: 74  Temp: 98 F (36.7 C)  Resp: 18   Filed Vitals:   11/18/12 0919  Height: 5' 9.5" (1.765 m)  Weight: 157 lb (71.215 kg)   Body mass index is 22.85 kg/(m^2). Ideal Body Weight: Weight in (lb) to have BMI = 25: 171.4   GEN: WDWN, NAD, Non-toxic, A & O x 3 HEENT: Atraumatic, Normocephalic. Neck supple. No masses, No LAD. Ears and Nose: No external deformity.  Green nasal drainage CV: RRR, No M/G/R. No JVD. No thrill. No  extra heart sounds. PULM: CTA B, no wheezes, crackles, rhonchi. No retractions. No resp. distress. No accessory muscle use. ABD: S, NT, ND, +BS. No rebound. No HSM. EXTR: No c/c/e NEURO Normal gait.  PSYCH: Normally interactive. Conversant. Not depressed or anxious appearing.  Calm demeanor.    Assessment and Plan: Sinusitis Bronchitis levaquin mucinex tussionex  Carmelina Dane, MD  Results for orders placed in visit on 11/18/12  POCT INFLUENZA A/B      Component Value Range   Influenza A, POC Negative     Influenza B, POC Negative

## 2012-11-27 ENCOUNTER — Telehealth: Payer: Self-pay

## 2012-11-27 NOTE — Telephone Encounter (Signed)
PT STATES THAT HE WAS SEEN EARLIER THIS WEEK FOR FLU LIKE SYMPTOMS, AFTER TAKING THE ANTIBIOTIC THAT WAS PRESCRIBED HE BEGAN TO FEEL BETTER BUT NOW IS FEELING WORSE. PT COMPLAINS OF NAUSEA, RUNNY NOSE, COUGHING, AND DIARRHEA. PT WOULD LIKE TO KNOW IF HE COULD POSSIBLY GET A DIFFERENT ANTIBIOTIC CALLED INTO THE PHARMACY. PHARMACY: [facility]  BEST# [phone]

## 2012-11-29 NOTE — Telephone Encounter (Signed)
Sounds like he needs to be seen again. May now have the flu

## 2012-11-30 NOTE — Telephone Encounter (Signed)
Pt reported that he is feeling much better since he stopped the Abx. He stated that the nausea/diarrhea were SEs of the Abx and they have resolved.

## 2013-02-17 ENCOUNTER — Telehealth: Payer: Self-pay

## 2013-02-17 NOTE — Telephone Encounter (Signed)
Thanks, left message for him to advise, instructed if he has questions, he is to return to clinic.

## 2013-02-17 NOTE — Telephone Encounter (Signed)
We have not seen this patient for evaluation of his allergies, and most insurance companies will not pay for Singulair until failure of not only zyrtec and claritin, but also nasal steroids. I recommend he come in to discuss his treatment options.

## 2013-02-17 NOTE — Telephone Encounter (Signed)
Pt is calling to see if he could be prescribed Singular for his allergies. He has tried clartin and zyrtec. The pharmacy he uses is [facility] [address] Call back number  Is [phone]

## 2014-07-21 ENCOUNTER — Encounter (HOSPITAL_COMMUNITY): Payer: Self-pay | Admitting: Emergency Medicine

## 2014-07-21 ENCOUNTER — Emergency Department (HOSPITAL_COMMUNITY)
Admission: EM | Admit: 2014-07-21 | Discharge: 2014-07-21 | Disposition: A | Discharge status: Home / Self Care | Payer: Medicaid Other | Attending: Emergency Medicine | Admitting: Emergency Medicine

## 2014-07-21 DIAGNOSIS — F411 Generalized anxiety disorder: Secondary | ICD-10-CM | POA: Diagnosis not present

## 2014-07-21 DIAGNOSIS — K644 Residual hemorrhoidal skin tags: Secondary | ICD-10-CM | POA: Insufficient documentation

## 2014-07-21 DIAGNOSIS — R112 Nausea with vomiting, unspecified: Secondary | ICD-10-CM | POA: Insufficient documentation

## 2014-07-21 DIAGNOSIS — Z79899 Other long term (current) drug therapy: Secondary | ICD-10-CM | POA: Diagnosis not present

## 2014-07-21 DIAGNOSIS — R197 Diarrhea, unspecified: Secondary | ICD-10-CM | POA: Insufficient documentation

## 2014-07-21 DIAGNOSIS — Z880 Allergy status to penicillin: Secondary | ICD-10-CM | POA: Insufficient documentation

## 2014-07-21 DIAGNOSIS — M129 Arthropathy, unspecified: Secondary | ICD-10-CM | POA: Diagnosis not present

## 2014-07-21 DIAGNOSIS — R110 Nausea: Secondary | ICD-10-CM

## 2014-07-21 DIAGNOSIS — Z87891 Personal history of nicotine dependence: Secondary | ICD-10-CM | POA: Diagnosis not present

## 2014-07-21 DIAGNOSIS — R109 Unspecified abdominal pain: Secondary | ICD-10-CM | POA: Insufficient documentation

## 2014-07-21 DIAGNOSIS — R42 Dizziness and giddiness: Secondary | ICD-10-CM | POA: Insufficient documentation

## 2014-07-21 LAB — COMPREHENSIVE METABOLIC PANEL
ALBUMIN: 4.1 g/dL (ref 3.5–5.2)
ALT: 10 U/L (ref 0–53)
AST: 12 U/L (ref 0–37)
Alkaline Phosphatase: 46 U/L (ref 39–117)
Anion gap: 10 (ref 5–15)
BUN: 11 mg/dL (ref 6–23)
CO2: 29 mEq/L (ref 19–32)
Calcium: 9.1 mg/dL (ref 8.4–10.5)
Chloride: 101 mEq/L (ref 96–112)
Creatinine, Ser: 0.84 mg/dL (ref 0.50–1.35)
GFR calc non Af Amer: >90 mL/min (ref >90)
GLUCOSE: 105 mg/dL — AB (ref 70–99)
Potassium: 3.7 mEq/L (ref 3.7–5.3)
Sodium: 140 mEq/L (ref 137–147)
TOTAL PROTEIN: 7 g/dL (ref 6.0–8.3)
Total Bilirubin: 0.5 mg/dL (ref 0.3–1.2)

## 2014-07-21 LAB — CBC WITH DIFFERENTIAL/PLATELET
BASOS PCT: 0 % (ref 0–1)
Basophils Absolute: 0 10*3/uL (ref 0.0–0.1)
EOS ABS: 0.1 10*3/uL (ref 0.0–0.7)
Eosinophils Relative: 1 % (ref 0–5)
HEMATOCRIT: 38.5 % — AB (ref 39.0–52.0)
HEMOGLOBIN: 13.1 g/dL (ref 13.0–17.0)
Lymphocytes Relative: 19 % (ref 12–46)
Lymphs Abs: 1.2 10*3/uL (ref 0.7–4.0)
MCH: 29.2 pg (ref 26.0–34.0)
MCHC: 34 g/dL (ref 30.0–36.0)
MCV: 85.7 fL (ref 78.0–100.0)
MONO ABS: 0.5 10*3/uL (ref 0.1–1.0)
MONOS PCT: 8 % (ref 3–12)
Neutro Abs: 4.4 10*3/uL (ref 1.7–7.7)
Neutrophils Relative %: 72 % (ref 43–77)
Platelets: 184 10*3/uL (ref 150–400)
RBC: 4.49 MIL/uL (ref 4.22–5.81)
RDW: 12.6 % (ref 11.5–15.5)
WBC: 6.1 10*3/uL (ref 4.0–10.5)

## 2014-07-21 LAB — URINALYSIS, ROUTINE W REFLEX MICROSCOPIC
BILIRUBIN URINE: NEGATIVE
Glucose, UA: NEGATIVE mg/dL
Hgb urine dipstick: NEGATIVE
Ketones, ur: NEGATIVE mg/dL
Leukocytes, UA: NEGATIVE
NITRITE: NEGATIVE
PROTEIN: NEGATIVE mg/dL
SPECIFIC GRAVITY, URINE: 1.014 (ref 1.005–1.030)
Urobilinogen, UA: 0.2 mg/dL (ref 0.0–1.0)
pH: 5 (ref 5.0–8.0)

## 2014-07-21 LAB — LIPASE, BLOOD: LIPASE: 16 U/L (ref 11–59)

## 2014-07-21 MED ORDER — SODIUM CHLORIDE 0.9 % IV BOLUS (SEPSIS)
1000.0000 mL | Freq: Once | INTRAVENOUS | Status: AC
  Administered 2014-07-21: 1000 mL via INTRAVENOUS

## 2014-07-21 MED ORDER — PANTOPRAZOLE SODIUM 40 MG IV SOLR
40.0000 mg | Freq: Once | INTRAVENOUS | Status: AC
  Administered 2014-07-21: 40 mg via INTRAVENOUS
  Filled 2014-07-21: qty 40

## 2014-07-21 MED ORDER — HYDROCORTISONE 2.5 % RE CREA: TOPICAL_CREAM | RECTAL | Status: DC

## 2014-07-21 MED ORDER — GI COCKTAIL ~~LOC~~
30.0000 mL | Freq: Once | ORAL | Status: AC
  Administered 2014-07-21: 30 mL via ORAL
  Filled 2014-07-21: qty 30

## 2014-07-21 NOTE — ED Notes (Signed)
Pt c/o off and on constipation then diarrhea, abd pain, nausea, and lightheadedness x 8 days, has diarrhea today.

## 2014-07-21 NOTE — ED Provider Notes (Signed)
CSN: [id number]     Arrival date & time 07/21/14  0815 History   First MD Initiated Contact with Patient 07/21/14 [phone]     Chief Complaint  Patient presents with  . Nausea  . Diarrhea  . Abdominal Pain  . Dizziness     (Consider location/radiation/quality/duration/timing/severity/associated sxs/prior Treatment) Patient is a 38 y.o. male presenting with diarrhea, abdominal pain, and dizziness. The history is provided by the patient.  Diarrhea Associated symptoms: abdominal pain and vomiting   Associated symptoms: no chills, no fever and no headaches   Abdominal Pain Associated symptoms: diarrhea and vomiting   Associated symptoms: no chest pain, no chills, no cough, no dysuria, no fever, no shortness of breath and no sore throat   Dizziness Associated symptoms: diarrhea and vomiting   Associated symptoms: no chest pain, no headaches and no shortness of breath   pt c/o intermittent, episodic, nvd, in the past week. Emesis clear, color of recently ingested fluids, of yellow, and of small volume.  No bloody, bilious, or projectile emesis. Diarrhea loose to watery, w occasional streaks of blood, pt noting hx hemorrhoids and recent flare of up hemorrhoids. Denies any recent change in meds or new meds. States normal for him is 1 bm q 2-3 days. States had chronic gi symptoms w irregular bms, cramping and nausea for the past few years, however last say gi in 2013, stating workup then including imaging and colonoscopy was negative. No recent abx use. No known ill contacts, bad food ingestion or travel. States takes chronic opiate medication for chronic knee pain  - no recent change.  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  No constant or focal abd pain, however does not intermittent, crampy, diffuse pain. No prior abd surgery. No hx gallstones, pud, or pancreatitis.     Past Medical History   Diagnosis Date  . Anxiety   . Allergy   . Arthritis    Past Surgical History  Procedure Laterality Date  . Knee arthroscopy  11/2009   Family History  Problem Relation Age of Onset  . Diabetes Father   . Diabetes Paternal Grandfather   . Diabetes Maternal Grandfather   . Colon cancer Neg Hx   . Esophageal cancer Neg Hx   . Stomach cancer Neg Hx   . Rectal cancer Neg Hx    History  Substance Use Topics  . Smoking status: Former Games developer  . Smokeless tobacco: Never Used  . Alcohol Use: Yes     Comment: 1 drink yearly    Review of Systems  Constitutional: Negative for fever and chills.  HENT: Negative for sore throat.   Eyes: Negative for redness.  Respiratory: Negative for cough and shortness of breath.   Cardiovascular: Negative for chest pain.  Gastrointestinal: Positive for vomiting, abdominal pain and diarrhea.  Endocrine: Negative for polyuria.  Genitourinary: Negative for dysuria and flank pain.  Musculoskeletal: Negative for back pain and neck pain.  Skin: Negative for rash.  Neurological: Positive for dizziness. Negative for headaches.  Hematological: Does not bruise/bleed easily.  Psychiatric/Behavioral: Negative for confusion.      Allergies  Penicillins  Home Medications   Prior to Admission medications   Medication Sig Start Date End Date Taking? Authorizing Provider  alprazolam Prudy Feeler) 2 MG tablet Take 2 mg by mouth at bedtime as needed.    Historical Provider, MD  bifidobacterium infantis (ALIGN) capsule  Take 1 capsule by mouth daily. 08/27/12   Beverley Fiedler, MD  cetirizine (ZYRTEC) 10 MG tablet Take 10 mg by mouth daily.    Historical Provider, MD  chlorpheniramine-HYDROcodone (TUSSIONEX PENNKINETIC ER) 10-8 MG/5ML LQCR Take 5 mLs by mouth every 12 (twelve) hours as needed (cough). 11/18/12   Carmelina Dane, MD  hyoscyamine (LEVSIN SL) 0.125 MG SL tablet Place 1-2 tablets (0.125-0.25 mg total) under the tongue as needed for cramping (abdominal  spasm/pain). 08/27/12   Beverley Fiedler, MD  Linaclotide (LINZESS) 290 MCG CAPS Take 290 mcg by mouth daily. 08/27/12   Beverley Fiedler, MD  meloxicam (MOBIC) 15 MG tablet Take 15 mg by mouth as needed.    Historical Provider, MD  metroNIDAZOLE (FLAGYL) 250 MG tablet Take 1 tablet (250 mg total) by mouth 3 (three) times daily. 08/27/12   Beverley Fiedler, MD  oxyCODONE-acetaminophen (PERCOCET) 7.5-325 MG per tablet Take 1 tablet by mouth every 6 (six) hours as needed for pain. 08/13/12   Beverley Fiedler, MD  triazolam (HALCION) 0.25 MG tablet Take 0.25 mg by mouth at bedtime as needed.    Historical Provider, MD   BP 154/94  Pulse 68  Temp(Src) 98 F (36.7 C) (Oral)  Resp 18  SpO2 97% Physical Exam  Nursing note and vitals reviewed. Constitutional: He is oriented to person, place, and time. He appears well-developed and well-nourished. No distress.  HENT:  Mouth/Throat: Oropharynx is clear and moist.  Eyes: Conjunctivae are normal. No scleral icterus.  Neck: Neck supple. No tracheal deviation present. No thyromegaly present.  Cardiovascular: Normal rate, regular rhythm, normal heart sounds and intact distal pulses.   No murmur heard. Pulmonary/Chest: Effort normal and breath sounds normal. No accessory muscle usage. No respiratory distress.  Abdominal: Soft. Bowel sounds are normal. He exhibits no distension and no mass. There is no tenderness. There is no rebound and no guarding.  No hernia.   Genitourinary:  No cva tenderness. Two thrombosed ext hemorrhoids, not acutely bleeding or inflamed. Stool soft brown heme neg.   Musculoskeletal: Normal range of motion. He exhibits no edema.  Neurological: He is alert and oriented to person, place, and time.  Skin: Skin is warm and dry. He is not diaphoretic.  Psychiatric:  Mildly anxious.     ED Course  Procedures (including critical care time) Labs Review  Results for orders placed during the hospital encounter of 07/21/14  CBC WITH DIFFERENTIAL       Result Value Ref Range   WBC 6.1  4.0 - 10.5 K/uL   RBC 4.49  4.22 - 5.81 MIL/uL   Hemoglobin 13.1  13.0 - 17.0 g/dL   HCT 16.1 (*) 09.6 - 04.5 %   MCV 85.7  78.0 - 100.0 fL   MCH 29.2  26.0 - 34.0 pg   MCHC 34.0  30.0 - 36.0 g/dL   RDW 40.9  81.1 - 91.4 %   Platelets 184  150 - 400 K/uL   Neutrophils Relative % 72  43 - 77 %   Neutro Abs 4.4  1.7 - 7.7 K/uL   Lymphocytes Relative 19  12 - 46 %   Lymphs Abs 1.2  0.7 - 4.0 K/uL   Monocytes Relative 8  3 - 12 %   Monocytes Absolute 0.5  0.1 - 1.0 K/uL   Eosinophils Relative 1  0 - 5 %   Eosinophils Absolute 0.1  0.0 - 0.7 K/uL   Basophils Relative 0  0 -  1 %   Basophils Absolute 0.0  0.0 - 0.1 K/uL  COMPREHENSIVE METABOLIC PANEL      Result Value Ref Range   Sodium 140  137 - 147 mEq/L   Potassium 3.7  3.7 - 5.3 mEq/L   Chloride 101  96 - 112 mEq/L   CO2 29  19 - 32 mEq/L   Glucose, Bld 105 (*) 70 - 99 mg/dL   BUN 11  6 - 23 mg/dL   Creatinine, Ser 4.09  0.50 - 1.35 mg/dL   Calcium 9.1  8.4 - 81.1 mg/dL   Total Protein 7.0  6.0 - 8.3 g/dL   Albumin 4.1  3.5 - 5.2 g/dL   AST 12  0 - 37 U/L   ALT 10  0 - 53 U/L   Alkaline Phosphatase 46  39 - 117 U/L   Total Bilirubin 0.5  0.3 - 1.2 mg/dL   GFR calc non Af Amer >90  >90 mL/min   GFR calc Af Amer >90  >90 mL/min   Anion gap 10  5 - 15  LIPASE, BLOOD      Result Value Ref Range   Lipase 16  11 - 59 U/L  URINALYSIS, ROUTINE W REFLEX MICROSCOPIC      Result Value Ref Range   Color, Urine YELLOW  YELLOW   APPearance CLOUDY (*) CLEAR   Specific Gravity, Urine 1.014  1.005 - 1.030   pH 5.0  5.0 - 8.0   Glucose, UA NEGATIVE  NEGATIVE mg/dL   Hgb urine dipstick NEGATIVE  NEGATIVE   Bilirubin Urine NEGATIVE  NEGATIVE   Ketones, ur NEGATIVE  NEGATIVE mg/dL   Protein, ur NEGATIVE  NEGATIVE mg/dL   Urobilinogen, UA 0.2  0.0 - 1.0 mg/dL   Nitrite NEGATIVE  NEGATIVE   Leukocytes, UA NEGATIVE  NEGATIVE      MDM   Iv ns bolus. Labs.  Reviewed nursing notes and prior  charts for additional history.   Recheck no nvd while in ed.   Received ns iv boluses.   abd soft nt.  Pt appears stable for d/c.  Given recurrent gi symptoms in past few years, will refer to gi.    For ext hemorrhoids, will refer to gen surg for outpatient f/u.     Suzi Roots, MD 07/21/14 [phone]

## 2014-07-21 NOTE — Discharge Instructions (Signed)
Drink plenty of fluids. Follow up with your doctor/gi doctor in coming week if symptoms fail to improve/resolve. For hemorrhoids, use anusol cream as need, try sitz baths as need for symptom relief. Follow up with general surgeon in the next 1-2 weeks - see referral - call office to arrange appointment. Return to ER if worse, new symptoms, fevers, persistent vomiting, severe abdominal pain, other concern.    Hemorrhoids Hemorrhoids are swollen veins around the rectum or anus. There are two types of hemorrhoids:   Internal hemorrhoids. These occur in the veins just inside the rectum. They may poke through to the outside and become irritated and painful.  External hemorrhoids. These occur in the veins outside the anus and can be felt as a painful swelling or hard lump near the anus. CAUSES  Pregnancy.   Obesity.   Constipation or diarrhea.   Straining to have a bowel movement.   Sitting for long periods on the toilet.  Heavy lifting or other activity that caused you to strain.  Anal intercourse. SYMPTOMS   Pain.   Anal itching or irritation.   Rectal bleeding.   Fecal leakage.   Anal swelling.   One or more lumps around the anus.  DIAGNOSIS  Your caregiver may be able to diagnose hemorrhoids by visual examination. Other examinations or tests that may be performed include:   Examination of the rectal area with a gloved hand (digital rectal exam).   Examination of anal canal using a small tube (scope).   A blood test if you have lost a significant amount of blood.  A test to look inside the colon (sigmoidoscopy or colonoscopy). TREATMENT Most hemorrhoids can be treated at home. However, if symptoms do not seem to be getting better or if you have a lot of rectal bleeding, your caregiver may perform a procedure to help make the hemorrhoids get smaller or remove them completely. Possible treatments include:   Placing a rubber band at the base of the  hemorrhoid to cut off the circulation (rubber band ligation).   Injecting a chemical to shrink the hemorrhoid (sclerotherapy).   Using a tool to burn the hemorrhoid (infrared light therapy).   Surgically removing the hemorrhoid (hemorrhoidectomy).   Stapling the hemorrhoid to block blood flow to the tissue (hemorrhoid stapling).  HOME CARE INSTRUCTIONS   Eat foods with fiber, such as whole grains, beans, nuts, fruits, and vegetables. Ask your doctor about taking products with added fiber in them (fibersupplements).  Increase fluid intake. Drink enough water and fluids to keep your urine clear or pale yellow.   Exercise regularly.   Go to the bathroom when you have the urge to have a bowel movement. Do not wait.   Avoid straining to have bowel movements.   Keep the anal area dry and clean. Use wet toilet paper or moist towelettes after a bowel movement.   Medicated creams and suppositories may be used or applied as directed.   Only take over-the-counter or prescription medicines as directed by your caregiver.   Take warm sitz baths for 15-20 minutes, 3-4 times a day to ease pain and discomfort.   Place ice packs on the hemorrhoids if they are tender and swollen. Using ice packs between sitz baths may be helpful.   Put ice in a plastic bag.   Place a towel between your skin and the bag.   Leave the ice on for 15-20 minutes, 3-4 times a day.   Do not use a donut-shaped pillow or  sit on the toilet for long periods. This increases blood pooling and pain.  SEEK MEDICAL CARE IF:  You have increasing pain and swelling that is not controlled by treatment or medicine.  You have uncontrolled bleeding.  You have difficulty or you are unable to have a bowel movement.  You have pain or inflammation outside the area of the hemorrhoids. MAKE SURE YOU:  Understand these instructions.  Will watch your condition.  Will get help right away if you are not doing well  or get worse. Document Released: 10/17/2000 Document Revised: 10/06/2012 Document Reviewed: 08/24/2012 [facility] Patient Information 2015 [place], [place]. This information is not intended to replace advice given to you by your health care provider. Make sure you discuss any questions you have with your health care provider.    Diarrhea Diarrhea is frequent loose and watery bowel movements. It can cause you to feel weak and dehydrated. Dehydration can cause you to become tired and thirsty, have a dry mouth, and have decreased urination that often is dark yellow. Diarrhea is a sign of another problem, most often an infection that will not last long. In most cases, diarrhea typically lasts 2-3 days. However, it can last longer if it is a sign of something more serious. It is important to treat your diarrhea as directed by your caregiver to lessen or prevent future episodes of diarrhea. CAUSES  Some common causes include:  Gastrointestinal infections caused by viruses, bacteria, or parasites.  Food poisoning or food allergies.  Certain medicines, such as antibiotics, chemotherapy, and laxatives.  Artificial sweeteners and fructose.  Digestive disorders. HOME CARE INSTRUCTIONS  Ensure adequate fluid intake (hydration): Have 1 cup (8 oz) of fluid for each diarrhea episode. Avoid fluids that contain simple sugars or sports drinks, fruit juices, whole milk products, and sodas. Your urine should be clear or pale yellow if you are drinking enough fluids. Hydrate with an oral rehydration solution that you can purchase at pharmacies, retail stores, and online. You can prepare an oral rehydration solution at home by mixing the following ingredients together:   - tsp table salt.   tsp baking soda.   tsp salt substitute containing potassium chloride.  1  tablespoons sugar.  1 L (34 oz) of water.  Certain foods and beverages may increase the speed at which food moves through the gastrointestinal  (GI) tract. These foods and beverages should be avoided and include:  Caffeinated and alcoholic beverages.  High-fiber foods, such as raw fruits and vegetables, nuts, seeds, and whole grain breads and cereals.  Foods and beverages sweetened with sugar alcohols, such as xylitol, sorbitol, and mannitol.  Some foods may be well tolerated and may help thicken stool including:  Starchy foods, such as rice, toast, pasta, low-sugar cereal, oatmeal, grits, baked potatoes, crackers, and bagels.  Bananas.  Applesauce.  Add probiotic-rich foods to help increase healthy bacteria in the GI tract, such as yogurt and fermented milk products.  Wash your hands well after each diarrhea episode.  Only take over-the-counter or prescription medicines as directed by your caregiver.  Take a warm bath to relieve any burning or pain from frequent diarrhea episodes. SEEK IMMEDIATE MEDICAL CARE IF:   You are unable to keep fluids down.  You have persistent vomiting.  You have blood in your stool, or your stools are black and tarry.  You do not urinate in 6-8 hours, or there is only a small amount of very dark urine.  You have abdominal pain that increases or  localizes.  You have weakness, dizziness, confusion, or light-headedness.  You have a severe headache.  Your diarrhea gets worse or does not get better.  You have a fever or persistent symptoms for more than 2-3 days.  You have a fever and your symptoms suddenly get worse. MAKE SURE YOU:   Understand these instructions.  Will watch your condition.  Will get help right away if you are not doing well or get worse. Document Released: 10/10/2002 Document Revised: 03/06/2014 Document Reviewed: 06/27/2012 [facility] Patient Information 2015 [place], [place]. This information is not intended to replace advice given to you by your health care provider. Make sure you discuss any questions you have with your health care provider.

## 2014-07-21 NOTE — Progress Notes (Signed)
[facility] Community Liaison [place],   [place] with patient about community resources to help him establish primary care. Provided pt with a list of self-pay providers. Patient stated that he was pending insurance through new job.

## 2014-08-09 ENCOUNTER — Emergency Department (HOSPITAL_COMMUNITY)
Admission: EM | Admit: 2014-08-09 | Discharge: 2014-08-09 | Disposition: A | Discharge status: Home / Self Care | Payer: Medicaid Other | Attending: Emergency Medicine | Admitting: Emergency Medicine

## 2014-08-09 ENCOUNTER — Encounter (HOSPITAL_COMMUNITY): Payer: Self-pay | Admitting: Emergency Medicine

## 2014-08-09 ENCOUNTER — Telehealth: Payer: Self-pay | Admitting: Internal Medicine

## 2014-08-09 ENCOUNTER — Emergency Department (HOSPITAL_COMMUNITY): Payer: Medicaid Other

## 2014-08-09 DIAGNOSIS — Z880 Allergy status to penicillin: Secondary | ICD-10-CM | POA: Insufficient documentation

## 2014-08-09 DIAGNOSIS — M1990 Unspecified osteoarthritis, unspecified site: Secondary | ICD-10-CM | POA: Insufficient documentation

## 2014-08-09 DIAGNOSIS — K529 Noninfective gastroenteritis and colitis, unspecified: Secondary | ICD-10-CM

## 2014-08-09 DIAGNOSIS — F419 Anxiety disorder, unspecified: Secondary | ICD-10-CM | POA: Diagnosis not present

## 2014-08-09 DIAGNOSIS — R1084 Generalized abdominal pain: Secondary | ICD-10-CM | POA: Diagnosis present

## 2014-08-09 DIAGNOSIS — Z87891 Personal history of nicotine dependence: Secondary | ICD-10-CM | POA: Insufficient documentation

## 2014-08-09 DIAGNOSIS — Z79899 Other long term (current) drug therapy: Secondary | ICD-10-CM | POA: Diagnosis not present

## 2014-08-09 DIAGNOSIS — K51919 Ulcerative colitis, unspecified with unspecified complications: Secondary | ICD-10-CM | POA: Diagnosis not present

## 2014-08-09 LAB — COMPREHENSIVE METABOLIC PANEL
ALT: 12 U/L (ref 0–53)
AST: 17 U/L (ref 0–37)
Albumin: 4.7 g/dL (ref 3.5–5.2)
Alkaline Phosphatase: 49 U/L (ref 39–117)
Anion gap: 14 (ref 5–15)
BUN: 12 mg/dL (ref 6–23)
CALCIUM: 10.1 mg/dL (ref 8.4–10.5)
CO2: 27 mEq/L (ref 19–32)
Chloride: 101 mEq/L (ref 96–112)
Creatinine, Ser: 0.74 mg/dL (ref 0.50–1.35)
GFR calc non Af Amer: >90 mL/min (ref >90)
Glucose, Bld: 115 mg/dL — ABNORMAL HIGH (ref 70–99)
Potassium: 5 mEq/L (ref 3.7–5.3)
SODIUM: 142 meq/L (ref 137–147)
TOTAL PROTEIN: 7.9 g/dL (ref 6.0–8.3)
Total Bilirubin: 1.2 mg/dL (ref 0.3–1.2)

## 2014-08-09 LAB — CBC WITH DIFFERENTIAL/PLATELET
BASOS ABS: 0 10*3/uL (ref 0.0–0.1)
BASOS PCT: 0 % (ref 0–1)
EOS ABS: 0 10*3/uL (ref 0.0–0.7)
EOS PCT: 0 % (ref 0–5)
HCT: 42.7 % (ref 39.0–52.0)
Hemoglobin: 14.3 g/dL (ref 13.0–17.0)
LYMPHS ABS: 1.1 10*3/uL (ref 0.7–4.0)
Lymphocytes Relative: 13 % (ref 12–46)
MCH: 28.4 pg (ref 26.0–34.0)
MCHC: 33.5 g/dL (ref 30.0–36.0)
MCV: 84.7 fL (ref 78.0–100.0)
Monocytes Absolute: 0.3 10*3/uL (ref 0.1–1.0)
Monocytes Relative: 4 % (ref 3–12)
NEUTROS PCT: 83 % — AB (ref 43–77)
Neutro Abs: 7 10*3/uL (ref 1.7–7.7)
PLATELETS: 247 10*3/uL (ref 150–400)
RBC: 5.04 MIL/uL (ref 4.22–5.81)
RDW: 12.6 % (ref 11.5–15.5)
WBC: 8.4 10*3/uL (ref 4.0–10.5)

## 2014-08-09 LAB — URINALYSIS, ROUTINE W REFLEX MICROSCOPIC
GLUCOSE, UA: NEGATIVE mg/dL
HGB URINE DIPSTICK: NEGATIVE
Ketones, ur: >80 mg/dL — AB
Leukocytes, UA: NEGATIVE
Nitrite: NEGATIVE
Protein, ur: NEGATIVE mg/dL
SPECIFIC GRAVITY, URINE: 1.034 — AB (ref 1.005–1.030)
Urobilinogen, UA: 1 mg/dL (ref 0.0–1.0)
pH: 5.5 (ref 5.0–8.0)

## 2014-08-09 LAB — LIPASE, BLOOD: Lipase: 31 U/L (ref 11–59)

## 2014-08-09 LAB — I-STAT CG4 LACTIC ACID, ED: LACTIC ACID, VENOUS: 2.25 mmol/L — AB (ref 0.5–2.2)

## 2014-08-09 MED ORDER — DICYCLOMINE HCL 10 MG PO CAPS
10.0000 mg | ORAL_CAPSULE | Freq: Once | ORAL | Status: AC
  Administered 2014-08-09: 10 mg via ORAL
  Filled 2014-08-09: qty 1

## 2014-08-09 MED ORDER — OXYCODONE-ACETAMINOPHEN 5-325 MG PO TABS
1.0000 | ORAL_TABLET | Freq: Once | ORAL | Status: AC
  Administered 2014-08-09: 1 via ORAL
  Filled 2014-08-09: qty 1

## 2014-08-09 MED ORDER — OXYCODONE-ACETAMINOPHEN 5-325 MG PO TABS: 1.0000 | ORAL_TABLET | Freq: Four times a day (QID) | ORAL | Status: DC | PRN

## 2014-08-09 MED ORDER — HYDROMORPHONE HCL 1 MG/ML IJ SOLN
1.0000 mg | Freq: Once | INTRAMUSCULAR | Status: AC
  Administered 2014-08-09: 1 mg via INTRAVENOUS
  Filled 2014-08-09: qty 1

## 2014-08-09 MED ORDER — METRONIDAZOLE 500 MG PO TABS
500.0000 mg | ORAL_TABLET | Freq: Once | ORAL | Status: AC
  Administered 2014-08-09: 500 mg via ORAL
  Filled 2014-08-09: qty 1

## 2014-08-09 MED ORDER — MORPHINE SULFATE 4 MG/ML IJ SOLN
4.0000 mg | Freq: Once | INTRAMUSCULAR | Status: AC
  Administered 2014-08-09: 4 mg via INTRAVENOUS
  Filled 2014-08-09: qty 1

## 2014-08-09 MED ORDER — SODIUM CHLORIDE 0.9 % IV BOLUS (SEPSIS)
1000.0000 mL | Freq: Once | INTRAVENOUS | Status: AC
  Administered 2014-08-09: 1000 mL via INTRAVENOUS

## 2014-08-09 MED ORDER — IOHEXOL 300 MG/ML  SOLN
100.0000 mL | Freq: Once | INTRAMUSCULAR | Status: AC | PRN
  Administered 2014-08-09: 100 mL via INTRAVENOUS

## 2014-08-09 MED ORDER — CIPROFLOXACIN HCL 500 MG PO TABS
500.0000 mg | ORAL_TABLET | Freq: Once | ORAL | Status: AC
  Administered 2014-08-09: 500 mg via ORAL
  Filled 2014-08-09: qty 1

## 2014-08-09 MED ORDER — CIPROFLOXACIN HCL 500 MG PO TABS: 500.0000 mg | ORAL_TABLET | Freq: Two times a day (BID) | ORAL | Status: DC

## 2014-08-09 MED ORDER — IOHEXOL 300 MG/ML  SOLN: 50.0000 mL | Freq: Once | INTRAMUSCULAR | Status: DC | PRN

## 2014-08-09 MED ORDER — METRONIDAZOLE 500 MG PO TABS: 500.0000 mg | ORAL_TABLET | Freq: Three times a day (TID) | ORAL | Status: DC

## 2014-08-09 MED ORDER — ONDANSETRON HCL 4 MG/2ML IJ SOLN
4.0000 mg | Freq: Once | INTRAMUSCULAR | Status: AC
  Administered 2014-08-09: 4 mg via INTRAVENOUS
  Filled 2014-08-09: qty 2

## 2014-08-09 NOTE — Telephone Encounter (Signed)
Left message for pt to call back  °

## 2014-08-09 NOTE — Discharge Instructions (Signed)

## 2014-08-09 NOTE — ED Notes (Signed)
MD at bedside. 

## 2014-08-09 NOTE — Telephone Encounter (Signed)
Wants to be seen today.Checking on status.

## 2014-08-09 NOTE — ED Notes (Signed)
Dr Harrison at bedside

## 2014-08-09 NOTE — ED Notes (Signed)
Patient back from testing Patient asking for additional pain medication r/t "cramping" Will make EDP aware

## 2014-08-09 NOTE — ED Notes (Signed)
Pt states he was here a week ago and was dx w/ gastroenteritis.  Pt states that he has continued vomiting and diarrhea with abd cramping.

## 2014-08-09 NOTE — ED Notes (Signed)
Patient with c/o abdominal pain, N/V/D for the past 3-4 days Patient was not able to get appointment with provider until tomorrow, so he came to ED Patient ambulatory from triage without difficulty Not actively vomiting--patient in NAD at this time Side rails up, call bell in reach

## 2014-08-09 NOTE — ED Notes (Signed)
Per Dr. Wilkie AyeHorton, patient to be re-assessed in 30 minutes by Dr. Romeo AppleHarrison for possible DC home

## 2014-08-09 NOTE — ED Provider Notes (Signed)
CSN: [id number]     Arrival date & time 08/09/14  1114 History   First MD Initiated Contact with Patient 08/09/14 1159     Chief Complaint  Patient presents with  . Abdominal Pain  . Nausea  . Emesis  . Diarrhea     (Consider location/radiation/quality/duration/timing/severity/associated sxs/prior Treatment) HPI  This is a 38 year old male who presents with abdominal pain, vomiting, and diarrhea. Patient was evaluated approximately 10 days ago and was diagnosed with gastroenteritis. Patient states that he fell a little bit better but never got rid of his symptoms. Over last 2-3 days he's had worsening abdominal cramping. It is periumbilical. Currently it is 10 out of 10. Nothing makes it better or worse. He has associated nonbloody diarrhea nonbloody emesis. He denies any fevers but does endorse chills.  Patient reports similar symptoms several years ago. He is followed by Dr. Rhea Belton, GI.    Past Medical History  Diagnosis Date  . Anxiety   . Allergy   . Arthritis    Past Surgical History  Procedure Laterality Date  . Knee arthroscopy  11/2009   Family History  Problem Relation Age of Onset  . Diabetes Father   . Diabetes Paternal Grandfather   . Diabetes Maternal Grandfather   . Colon cancer Neg Hx   . Esophageal cancer Neg Hx   . Stomach cancer Neg Hx   . Rectal cancer Neg Hx    History  Substance Use Topics  . Smoking status: Former Games developer  . Smokeless tobacco: Never Used  . Alcohol Use: Yes     Comment: 1 drink yearly    Review of Systems  Constitutional: Positive for chills. Negative for fever.  Respiratory: Negative.  Negative for chest tightness and shortness of breath.   Cardiovascular: Negative.  Negative for chest pain.  Gastrointestinal: Positive for nausea, vomiting, abdominal pain and diarrhea.  Genitourinary: Negative.  Negative for dysuria.  Musculoskeletal: Negative for back pain.  Skin: Negative for rash.  Neurological: Negative for headaches.   All other systems reviewed and are negative.     Allergies  Penicillins  Home Medications   Prior to Admission medications   Medication Sig Start Date End Date Taking? Authorizing Provider  alprazolam Prudy Feeler) 2 MG tablet Take 2 mg by mouth 2 (two) times daily.    Yes Historical Provider, MD  hydrocortisone (ANUSOL-HC) 2.5 % rectal cream Place 2 application rectally 2 (two) times daily as needed (hemorrhoids). Apply rectally 2 times daily prn 07/21/14  Yes Suzi Roots, MD  ibuprofen (ADVIL,MOTRIN) 100 MG tablet Take 200 mg by mouth every 6 (six) hours as needed for fever (headache).   Yes Historical Provider, MD  magnesium hydroxide (MILK OF MAGNESIA) 400 MG/5ML suspension Take 60 mLs by mouth every 6 (six) hours as needed for mild constipation (constipation).    Yes Historical Provider, MD  montelukast (SINGULAIR) 10 MG tablet Take 10 mg by mouth at bedtime.   Yes Historical Provider, MD  oxyCODONE (ROXICODONE) 15 MG immediate release tablet Take 15 mg by mouth every 4 (four) hours as needed for pain (knee pain).    Yes Historical Provider, MD  rizatriptan (MAXALT) 10 MG tablet Take 10 mg by mouth as needed for migraine (migraine). May repeat in 2 hours if needed   Yes Historical Provider, MD  triazolam (HALCION) 0.25 MG tablet Take 0.5 mg by mouth at bedtime.    Yes Historical Provider, MD  ciprofloxacin (CIPRO) 500 MG tablet Take 1 tablet (500 mg total)  by mouth every 12 (twelve) hours. 08/09/14   Shon Baton, MD  metroNIDAZOLE (FLAGYL) 500 MG tablet Take 1 tablet (500 mg total) by mouth 3 (three) times daily. 08/09/14   Shon Baton, MD  oxyCODONE-acetaminophen (PERCOCET/ROXICET) 5-325 MG per tablet Take 1-2 tablets by mouth every 6 (six) hours as needed for moderate pain or severe pain. 08/09/14   Shon Baton, MD   BP 132/75  Pulse 86  Temp(Src) 98.3 F (36.8 C) (Oral)  Resp 18  SpO2 100% Physical Exam  Nursing note and vitals reviewed. Constitutional: He is  oriented to person, place, and time. He appears well-developed and well-nourished.  Uncomfortable appearing, nontoxic  HENT:  Head: Normocephalic and atraumatic.  Eyes: Pupils are equal, round, and reactive to light.  Cardiovascular: Normal rate, regular rhythm and normal heart sounds.   No murmur heard. Pulmonary/Chest: Effort normal and breath sounds normal. No respiratory distress. He has no wheezes.  Abdominal: Soft. Bowel sounds are normal. There is tenderness. There is guarding. There is no rebound.  Diffuse tenderness to palpation, voluntary guarding  Musculoskeletal: He exhibits no edema.  Neurological: He is alert and oriented to person, place, and time.  Skin: Skin is warm and dry.  Psychiatric: He has a normal mood and affect.    ED Course  Procedures (including critical care time) Labs Review Labs Reviewed  CBC WITH DIFFERENTIAL - Abnormal; Notable for the following:    Neutrophils Relative % 83 (*)    All other components within normal limits  COMPREHENSIVE METABOLIC PANEL - Abnormal; Notable for the following:    Glucose, Bld 115 (*)    All other components within normal limits  URINALYSIS, ROUTINE W REFLEX MICROSCOPIC - Abnormal; Notable for the following:    Color, Urine AMBER (*)    Specific Gravity, Urine 1.034 (*)    Bilirubin Urine SMALL (*)    Ketones, ur >80 (*)    All other components within normal limits  I-STAT CG4 LACTIC ACID, ED - Abnormal; Notable for the following:    Lactic Acid, Venous 2.25 (*)    All other components within normal limits  LIPASE, BLOOD    Imaging Review Ct Abdomen Pelvis W Contrast  08/09/2014   CLINICAL DATA:  Abdominal pain, nausea, vomiting, and diarrhea for 3-4 days, former smoker  EXAM: CT ABDOMEN AND PELVIS WITH CONTRAST  TECHNIQUE: Multidetector CT imaging of the abdomen and pelvis was performed using the standard protocol following bolus administration of intravenous contrast. Sagittal and coronal MPR images  reconstructed from axial data set.  CONTRAST:  OMNIPAQUE IOHEXOL 300 MG/ML SOLN IV. Dilute oral contrast.  COMPARISON:  08/26/2012  FINDINGS: Lung bases clear.  Liver, spleen, pancreas, kidneys, and adrenal glands normal appearance.  Wall thickening of a long segment of ascending colon and cecum favoring colitis.  Remainder of colon is normal in caliber with bowel wall thickening.  Stomach and bowel loops normal appearance.  Normal appendix and bladder with ight ureteral dilatation.  Numerous pelvic phleboliths.  No mass, adenopathy, free fluid or free air.  No hernia or acute bone lesions.  IMPRESSION: Long segment of wall thickening involving the ascending colon and cecum most likely representing colitis.  Differential diagnosis includes infection and inflammatory bowel disease, ischemia considered unlikely due to age and distribution.  Remainder of exam unremarkable.   Electronically Signed   By: Ulyses Southward M.D.   On: 08/09/2014 14:12     EKG Interpretation None  MDM   Final diagnoses:  Colitis    Patient presents with persistent abdominal pain, vomiting, and diarrhea. He is uncomfortable appearing but nontoxic on exam. He has diffuse tenderness to palpation with voluntary guarding. I reviewed the patient's record. Patient had a colonoscopy in 2013 for a abnormal CT scan. Colonoscopy was reassuring. Basic labwork obtained.  Is largely reassuring without evidence of leukocytosis. Mild lactic acidosis and greater than 80 ketones in the urine. Patient was given pain medication and fluids. Given persistence of symptoms, will obtain CT scan.  CT scan with evidence of colitis. It is similar when compared to prior CT in 2013. It would be consistent with patient's current symptoms. Patient given Cipro and Flagyl. He continues to have some pain. We'll reduce pain medication. If patient's pain can be controlled, he can be discharged home with close GI followup.  SIgned out to Dr.  Cherlynn JuneHarrison  Courtney F Horton, MD 08/09/14 [phone]

## 2014-08-09 NOTE — ED Notes (Signed)
Patient states that previously given Dilaudid IV and Percocet PO were effective in relieving abdominal cramps Patient states that "pain has returned" and would like additional dose of the previously given medications Will make Dr. Romeo AppleHarrison aware

## 2014-08-09 NOTE — ED Notes (Signed)
Patient transported to CT via stretcher Patient in NAD upon leaving for testing 

## 2014-08-09 NOTE — Telephone Encounter (Signed)
Pt states that he is having very bad lower abdominal cramping, states it has been going on for about 3 days. Pt states the cramping and spasms are really bad today and he would like to be seen. Offered pt an appt for tomorrow but he states he cannot wait that long. Asked pt if he was taking his Levsin and it looks like the pt never got that script filled that was sent in quite some time ago. Pt did not know what med I was referring to. Please advise.

## 2014-08-09 NOTE — ED Notes (Signed)
Dr. Wilkie AyeHorton made aware of patient's request for pain medication

## 2014-08-09 NOTE — Telephone Encounter (Signed)
Is he having constipation or diarrhea, previously he was using Linzess with success when he was constipated Agree with Levsin as an anti-spasmodic for lower abdominal pain Appointment tomorrow is reasonable and if patient cannot wait he can be seen in the urgent care or ER

## 2014-08-10 ENCOUNTER — Encounter (HOSPITAL_COMMUNITY): Payer: Self-pay | Admitting: *Deleted

## 2014-08-10 ENCOUNTER — Ambulatory Visit (HOSPITAL_COMMUNITY): Payer: Self-pay | Admitting: Physician Assistant

## 2014-08-10 ENCOUNTER — Encounter: Payer: Self-pay | Admitting: Physician Assistant

## 2014-08-10 ENCOUNTER — Inpatient Hospital Stay (HOSPITAL_COMMUNITY)
Admission: AD | Admit: 2014-08-10 | Discharge: 2014-08-13 | DRG: 392 | Disposition: A | Discharge status: Home / Self Care | Payer: Medicaid Other | Source: Ambulatory Visit | Attending: Gastroenterology | Admitting: Gastroenterology

## 2014-08-10 VITALS — BP 118/80 | HR 56 | Ht 69.0 in | Wt 131.1 lb

## 2014-08-10 DIAGNOSIS — F411 Generalized anxiety disorder: Secondary | ICD-10-CM

## 2014-08-10 DIAGNOSIS — K648 Other hemorrhoids: Secondary | ICD-10-CM | POA: Diagnosis present

## 2014-08-10 DIAGNOSIS — M1990 Unspecified osteoarthritis, unspecified site: Secondary | ICD-10-CM | POA: Diagnosis present

## 2014-08-10 DIAGNOSIS — Z79899 Other long term (current) drug therapy: Secondary | ICD-10-CM

## 2014-08-10 DIAGNOSIS — R109 Unspecified abdominal pain: Secondary | ICD-10-CM

## 2014-08-10 DIAGNOSIS — E43 Unspecified severe protein-calorie malnutrition: Secondary | ICD-10-CM | POA: Insufficient documentation

## 2014-08-10 DIAGNOSIS — Z681 Body mass index (BMI) 19 or less, adult: Secondary | ICD-10-CM | POA: Diagnosis not present

## 2014-08-10 DIAGNOSIS — K625 Hemorrhage of anus and rectum: Secondary | ICD-10-CM | POA: Diagnosis present

## 2014-08-10 DIAGNOSIS — R634 Abnormal weight loss: Secondary | ICD-10-CM | POA: Diagnosis present

## 2014-08-10 DIAGNOSIS — F952 Tourette's disorder: Secondary | ICD-10-CM | POA: Diagnosis present

## 2014-08-10 DIAGNOSIS — G8929 Other chronic pain: Secondary | ICD-10-CM

## 2014-08-10 DIAGNOSIS — R933 Abnormal findings on diagnostic imaging of other parts of digestive tract: Secondary | ICD-10-CM

## 2014-08-10 DIAGNOSIS — Z87891 Personal history of nicotine dependence: Secondary | ICD-10-CM

## 2014-08-10 DIAGNOSIS — F419 Anxiety disorder, unspecified: Secondary | ICD-10-CM | POA: Diagnosis present

## 2014-08-10 DIAGNOSIS — J309 Allergic rhinitis, unspecified: Secondary | ICD-10-CM | POA: Diagnosis present

## 2014-08-10 DIAGNOSIS — R197 Diarrhea, unspecified: Secondary | ICD-10-CM

## 2014-08-10 DIAGNOSIS — Z880 Allergy status to penicillin: Secondary | ICD-10-CM | POA: Diagnosis not present

## 2014-08-10 DIAGNOSIS — K529 Noninfective gastroenteritis and colitis, unspecified: Principal | ICD-10-CM | POA: Diagnosis present

## 2014-08-10 LAB — CBC
HCT: 37.3 % — ABNORMAL LOW (ref 39.0–52.0)
Hemoglobin: 12.8 g/dL — ABNORMAL LOW (ref 13.0–17.0)
MCH: 29.1 pg (ref 26.0–34.0)
MCHC: 34.3 g/dL (ref 30.0–36.0)
MCV: 84.8 fL (ref 78.0–100.0)
PLATELETS: 204 10*3/uL (ref 150–400)
RBC: 4.4 MIL/uL (ref 4.22–5.81)
RDW: 12.5 % (ref 11.5–15.5)
WBC: 7.3 10*3/uL (ref 4.0–10.5)

## 2014-08-10 LAB — COMPREHENSIVE METABOLIC PANEL
ALT: 9 U/L (ref 0–53)
AST: 13 U/L (ref 0–37)
Albumin: 4.2 g/dL (ref 3.5–5.2)
Alkaline Phosphatase: 43 U/L (ref 39–117)
Anion gap: 14 (ref 5–15)
BILIRUBIN TOTAL: 0.9 mg/dL (ref 0.3–1.2)
BUN: 9 mg/dL (ref 6–23)
CO2: 25 meq/L (ref 19–32)
Calcium: 9.1 mg/dL (ref 8.4–10.5)
Chloride: 99 mEq/L (ref 96–112)
Creatinine, Ser: 0.79 mg/dL (ref 0.50–1.35)
GFR calc Af Amer: >90 mL/min (ref >90)
Glucose, Bld: 127 mg/dL — ABNORMAL HIGH (ref 70–99)
Potassium: 4 mEq/L (ref 3.7–5.3)
SODIUM: 138 meq/L (ref 137–147)
Total Protein: 6.8 g/dL (ref 6.0–8.3)

## 2014-08-10 LAB — C-REACTIVE PROTEIN: CRP: <0.5 mg/dL — ABNORMAL LOW (ref <0.60)

## 2014-08-10 MED ORDER — OXYCODONE HCL 5 MG PO TABS
5.0000 mg | ORAL_TABLET | ORAL | Status: DC | PRN
  Administered 2014-08-10 – 2014-08-13 (×11): 5 mg via ORAL
  Filled 2014-08-10 (×11): qty 1

## 2014-08-10 MED ORDER — ONDANSETRON HCL 4 MG PO TABS: 4.0000 mg | ORAL_TABLET | Freq: Four times a day (QID) | ORAL | Status: DC | PRN

## 2014-08-10 MED ORDER — KCL IN DEXTROSE-NACL 10-5-0.45 MEQ/L-%-% IV SOLN
INTRAVENOUS | Status: DC
  Administered 2014-08-10 – 2014-08-13 (×7): via INTRAVENOUS
  Filled 2014-08-10 (×10): qty 1000

## 2014-08-10 MED ORDER — DICYCLOMINE HCL 10 MG PO CAPS
10.0000 mg | ORAL_CAPSULE | Freq: Four times a day (QID) | ORAL | Status: DC | PRN
Start: 2014-08-10 — End: 2014-08-11
  Administered 2014-08-10 (×2): 10 mg via ORAL
  Filled 2014-08-10 (×2): qty 1

## 2014-08-10 MED ORDER — SACCHAROMYCES BOULARDII 250 MG PO CAPS
250.0000 mg | ORAL_CAPSULE | Freq: Two times a day (BID) | ORAL | Status: DC
  Administered 2014-08-10 – 2014-08-13 (×7): 250 mg via ORAL
  Filled 2014-08-10 (×8): qty 1

## 2014-08-10 MED ORDER — ONDANSETRON HCL 4 MG PO TABS
4.0000 mg | ORAL_TABLET | Freq: Four times a day (QID) | ORAL | Status: DC
  Administered 2014-08-10 – 2014-08-11 (×2): 4 mg via ORAL
  Filled 2014-08-10 (×13): qty 1

## 2014-08-10 MED ORDER — METRONIDAZOLE 250 MG PO TABS
250.0000 mg | ORAL_TABLET | Freq: Four times a day (QID) | ORAL | Status: DC
  Administered 2014-08-10 – 2014-08-13 (×11): 250 mg via ORAL
  Filled 2014-08-10 (×18): qty 1

## 2014-08-10 MED ORDER — ACETAMINOPHEN 325 MG PO TABS
650.0000 mg | ORAL_TABLET | Freq: Four times a day (QID) | ORAL | Status: DC | PRN
  Administered 2014-08-11: 650 mg via ORAL
  Filled 2014-08-10: qty 2

## 2014-08-10 MED ORDER — ONDANSETRON HCL 4 MG/2ML IJ SOLN
4.0000 mg | Freq: Four times a day (QID) | INTRAMUSCULAR | Status: DC | PRN
  Administered 2014-08-10: 4 mg via INTRAVENOUS
  Filled 2014-08-10: qty 2

## 2014-08-10 MED ORDER — METOCLOPRAMIDE HCL 5 MG/ML IJ SOLN
10.0000 mg | Freq: Once | INTRAMUSCULAR | Status: AC
  Administered 2014-08-10: 10 mg via INTRAVENOUS
  Filled 2014-08-10: qty 2

## 2014-08-10 MED ORDER — ACETAMINOPHEN 650 MG RE SUPP
650.0000 mg | Freq: Four times a day (QID) | RECTAL | Status: DC | PRN
  Filled 2014-08-10: qty 1

## 2014-08-10 MED ORDER — PEG-KCL-NACL-NASULF-NA ASC-C 100 G PO SOLR
0.5000 | Freq: Once | ORAL | Status: AC
  Administered 2014-08-11: 100 g via ORAL

## 2014-08-10 MED ORDER — SODIUM CHLORIDE 0.9 % IV SOLN: INTRAVENOUS | Status: DC

## 2014-08-10 MED ORDER — ONDANSETRON HCL 4 MG/2ML IJ SOLN
4.0000 mg | Freq: Four times a day (QID) | INTRAMUSCULAR | Status: DC
  Administered 2014-08-10 – 2014-08-13 (×8): 4 mg via INTRAVENOUS
  Filled 2014-08-10 (×7): qty 2

## 2014-08-10 MED ORDER — HYDROMORPHONE HCL 1 MG/ML IJ SOLN
0.5000 mg | INTRAMUSCULAR | Status: DC | PRN
  Administered 2014-08-10: 0.5 mg via INTRAVENOUS
  Filled 2014-08-10: qty 1

## 2014-08-10 MED ORDER — PEG-KCL-NACL-NASULF-NA ASC-C 100 G PO SOLR
0.5000 | Freq: Once | ORAL | Status: AC
  Administered 2014-08-10: 100 g via ORAL
  Filled 2014-08-10: qty 1

## 2014-08-10 MED ORDER — HYDROMORPHONE HCL 1 MG/ML IJ SOLN
1.0000 mg | INTRAMUSCULAR | Status: DC | PRN
  Administered 2014-08-10 – 2014-08-13 (×15): 1 mg via INTRAVENOUS
  Filled 2014-08-10 (×15): qty 1

## 2014-08-10 MED ORDER — ALPRAZOLAM 1 MG PO TABS
2.0000 mg | ORAL_TABLET | Freq: Two times a day (BID) | ORAL | Status: DC | PRN
  Administered 2014-08-10 – 2014-08-13 (×6): 2 mg via ORAL
  Filled 2014-08-10 (×6): qty 2

## 2014-08-10 MED ORDER — PEG-KCL-NACL-NASULF-NA ASC-C 100 G PO SOLR: 1.0000 | Freq: Once | ORAL | Status: DC

## 2014-08-10 MED ORDER — METOCLOPRAMIDE HCL 5 MG/ML IJ SOLN
10.0000 mg | Freq: Once | INTRAMUSCULAR | Status: AC
  Administered 2014-08-11: 10 mg via INTRAVENOUS
  Filled 2014-08-10: qty 2

## 2014-08-10 MED ORDER — MONTELUKAST SODIUM 10 MG PO TABS
10.0000 mg | ORAL_TABLET | Freq: Every day | ORAL | Status: DC
  Administered 2014-08-10 – 2014-08-12 (×3): 10 mg via ORAL
  Filled 2014-08-10 (×4): qty 1

## 2014-08-10 NOTE — Progress Notes (Signed)
    Patient admitted today with abdominal pain, nausea, vomiting, diarrhea and right sided colitis on CTscan. Plan is for colonoscopy in am. Patient vomited when arrived to floor. Hopefully he will be able to tolerate bowel prep, Will change prn Zofran to scheduled Q6 hours dosing and give IV Reglan 30 minutes prior to each dose of bowel prep.   Admission labs reviewed. Electrolytes okay, WBC normal. Hgb 12.8. Stool studies pending   Willette ClusterPaula Norvil Martensen, NP

## 2014-08-10 NOTE — Telephone Encounter (Signed)
Pt states he did not call back yesterday because he was in the ER again. Pt states he is no better and would like to be seen today. Pt scheduled to see Amy Esterwood PA today at 10am. Pt aware of appt.

## 2014-08-10 NOTE — Patient Instructions (Signed)
[facility]. You will be going to [address].  They will assist you in getting to your room.

## 2014-08-10 NOTE — Progress Notes (Signed)
Subjective:    Patient ID: Shane Curry, male    DOB: Sep 01, 1976, 38 y.o.   MRN: [id number]  HPI  Shane Curry is a pleasant 38 year old white male known to Dr. Rhea BeltonPyrtle. He had been seen in 2013 with complaints of abdominal pain and chronic constipation and had undergone a colonoscopy in October of 2013. He had a poor prep but was felt to be a normal exam. He was given a trial of limbs S. at that time. We have not seen him in the past 2 years. He has history of chronic anxiety and was diagnosed with corrects many years ago. He says he is been having ongoing GI issues but just did not come back. He says the lens asked not work for him. Now over the past couple of months with increased abdominal pain and cramping. He had an ER visit in mid September with new onset of diarrhea along with his abdominal pain. He did not have any imaging at that time and was diagnosed with a gastroenteritis. Now over the past 3 weeks his symptoms have progressed. He feels very poorly has been having intermittent chills and sweats, daily watery diarrhea with at least 10 bowel movements per day. He has been seeing intermittent bright red blood in his stools, and has been having nausea and vomiting intermittently over the past week. He says he has severe ongoing abdominal cramping and has been unable to eat. His weight is down about 9 pounds. He has not been on any recent antibiotics, no foreign travel or known exposures, no new meds etc. He went back to the emergency room yesterday 08/09/2014. At that time he had CT scan of the abdomen and pelvis with contrast which does show wall thickening of a long segment of a ascending colon and cecum consistent with a colitis ,remainder of colon normal in caliber with bowel wall thickening. WBC 8.4 hemoglobin 14.3 hematocrit of 42.7 lactate level slightly elevated at 2.25 cemented unremarkable.    Review of Systems  Constitutional: Positive for chills, appetite change, fatigue and unexpected  weight change.  HENT: Negative.   Eyes: Negative.   Respiratory: Negative.   Gastrointestinal: Positive for nausea, vomiting, abdominal pain, diarrhea and blood in stool.  Endocrine: Negative.   Genitourinary: Negative.   Musculoskeletal: Positive for arthralgias.  Skin: Negative.   Allergic/Immunologic: Negative.   Neurological: Negative.   Hematological: Negative.   Psychiatric/Behavioral: The patient is nervous/anxious.    No facility-administered medications prior to visit.   Outpatient Prescriptions Prior to Visit  Medication Sig Dispense Refill  . alprazolam (XANAX) 2 MG tablet Take 2 mg by mouth 2 (two) times daily.       . ciprofloxacin (CIPRO) 500 MG tablet Take 1 tablet (500 mg total) by mouth every 12 (twelve) hours.  28 tablet  0  . hydrocortisone (ANUSOL-HC) 2.5 % rectal cream Place 2 application rectally 2 (two) times daily as needed (hemorrhoids). Apply rectally 2 times daily prn      . ibuprofen (ADVIL,MOTRIN) 100 MG tablet Take 200 mg by mouth every 6 (six) hours as needed for fever (headache).      . magnesium hydroxide (MILK OF MAGNESIA) 400 MG/5ML suspension Take 60 mLs by mouth every 6 (six) hours as needed for mild constipation (constipation).       . metroNIDAZOLE (FLAGYL) 500 MG tablet Take 1 tablet (500 mg total) by mouth 3 (three) times daily.  42 tablet  0  . montelukast (SINGULAIR) 10 MG tablet Take 10  mg by mouth at bedtime.      Marland Kitchen. oxyCODONE (ROXICODONE) 15 MG immediate release tablet Take 15 mg by mouth every 4 (four) hours as needed for pain (knee pain).       Marland Kitchen. oxyCODONE-acetaminophen (PERCOCET/ROXICET) 5-325 MG per tablet Take 1-2 tablets by mouth every 6 (six) hours as needed for moderate pain or severe pain.  20 tablet  0  . rizatriptan (MAXALT) 10 MG tablet Take 10 mg by mouth as needed for migraine (migraine). May repeat in 2 hours if needed      . triazolam (HALCION) 0.25 MG tablet Take 0.5 mg by mouth at bedtime.        Allergies  Allergen  Reactions  . Penicillins     Childhood reaction    Patient Active Problem List   Diagnosis Date Noted  . Generalized anxiety disorder 08/10/2014  . Acute colitis 08/10/2014  . Chronic abdominal pain 08/06/2012  . Tourette's syndrome 08/06/2012  . Constipation 08/06/2012   History  Substance Use Topics  . Smoking status: Former Games developermoker  . Smokeless tobacco: Never Used  . Alcohol Use: Yes     Comment: 1 drink yearly   family history includes Diabetes in his father, maternal grandfather, and paternal grandfather. There is no history of Colon cancer, Esophageal cancer, Stomach cancer, or Rectal cancer.     Objective:   Physical Exam  well-developed thin ill-appearing white male accompanied by his wife blood pressure 118/80 pulse in the 60s height 5 foot 9 weight 131 down 10 pounds. HEENT; nontraumatic normocephalic EOMI PERRLA sclera anicteric, Supple ;no JVD, Cardiovascular; regular rate and rhythm with S1-S2 no murmur or gallop, Pulmonary; clear bilaterally, Abdomen; soft bowel sounds are present she is rather diffusely tender more so in the right lower quadrant no guarding or rebound no palpable mass or hepatosplenomegaly rectal exam not done, Extremities ;no clubbing cyanosis or edema skin warm and dry, Psych; anxious but appropriate        Assessment & Plan:  Number 1:30 H. or old white male with a several year history complaints of abdominal pain and chronic constipation now with one month history of progressing abdominal pain cramping diarrhea intermittent rectal bleeding nausea and vomiting. CT scan of the abdomen and pelvis done yesterday through the emergency room consistent with an ascending  colitis involving a long segment. While this may be infectious he has been ill for several weeks at this point and am concerned he has new onset of IBD. #2 chronic anxiety #3 history of Tourette's  Plan; patient will be admitted to the GI service for IV fluid rehydration and pain  control, will place him on oral metronidazole and hold Cipro until stool pathogen panel returns and we rule out C. Difficile. Clear liquid diet Continue usual dose of Xanax Sedimentation rate and CRP Patient will need colonoscopy during his hospital stay for diagnostic purposes.

## 2014-08-10 NOTE — H&P (Signed)
Admission Note  Primary Care Physician:  Tally DueGUEST, [name], MD Primary Gastroenterologist:    Dr. Chestine SporeJ. Pyrtle  HPI: Shane Curry is a 38 y.o. male known to Dr. Rhea BeltonPyrtle from prior colonoscopy done in October of 2013. This was done for complaints of abdominal pain and chronic constipation and though he had a poor prep was felt to be a normal exam. He has not been seen in the office in almost 2 years. He comes in today as an urgent add-on after an ER visit last night. Patient and his wife state that he is been having ongoing GI issues over the past couple of years. His symptoms have acutely worsened over the past month or so. He had an ER visit in mid September with complaints of abdominal pain diarrhea and nausea. He did not have any imaging at that time but was diagnosed with gastroenteritis. Now over the past 3 weeks he has continued to have multiple watery bowel movements per day. Patient states at least 10 bowel movements per day sometimes containing small amounts of bright red blood. He has had progressively worsening abdominal pain and cramping intermittent nausea and vomiting this week. His appetite has been poor and he is been unable to keep down any by mouth is over the past 2-3 days other than sips of water. They have not taken his temperature at home but he has been having hot and cold spells and sweats. Patient's weight is down 9 pounds over the past month. He had gone back to the emergency room yesterday, labs are reviewed and were unremarkable with the exception of a slightly elevated lactate level CT scan of the abdomen and pelvis was done which showed a long segment of wall thickening involving the ascending colon and cecum consistent with colitis- some wall thickening of the remaining of the colon but with normal caliber. Patient was seen and evaluated in the office today felt to be acutely ill and admitted to the hospital for pain control IV fluid rehydration antibiotics stool  cultures and colonoscopy   Past Medical History  Diagnosis Date  . Anxiety   . Allergy   . Arthritis     Past Surgical History  Procedure Laterality Date  . Knee arthroscopy Left 11/2009    Prior to Admission medications   Medication Sig Start Date End Date Taking? Authorizing Provider  alprazolam Prudy Feeler(XANAX) 2 MG tablet Take 2 mg by mouth 2 (two) times daily.     Historical Provider, MD  ciprofloxacin (CIPRO) 500 MG tablet Take 1 tablet (500 mg total) by mouth every 12 (twelve) hours. 08/09/14   Shon Batonourtney F Horton, MD  hydrocortisone (ANUSOL-HC) 2.5 % rectal cream Place 2 application rectally 2 (two) times daily as needed (hemorrhoids). Apply rectally 2 times daily prn 07/21/14   Suzi RootsKevin E Steinl, MD  ibuprofen (ADVIL,MOTRIN) 100 MG tablet Take 200 mg by mouth every 6 (six) hours as needed for fever (headache).    Historical Provider, MD  magnesium hydroxide (MILK OF MAGNESIA) 400 MG/5ML suspension Take 60 mLs by mouth every 6 (six) hours as needed for mild constipation (constipation).     Historical Provider, MD  metroNIDAZOLE (FLAGYL) 500 MG tablet Take 1 tablet (500 mg total) by mouth 3 (three) times daily. 08/09/14   Shon Batonourtney F Horton, MD  montelukast (SINGULAIR) 10 MG tablet Take 10 mg by mouth at bedtime.    Historical Provider, MD  oxyCODONE (ROXICODONE) 15 MG immediate release tablet Take 15 mg by mouth every  4 (four) hours as needed for pain (knee pain).     Historical Provider, MD  oxyCODONE-acetaminophen (PERCOCET/ROXICET) 5-325 MG per tablet Take 1-2 tablets by mouth every 6 (six) hours as needed for moderate pain or severe pain. 08/09/14   Shon Baton, MD  rizatriptan (MAXALT) 10 MG tablet Take 10 mg by mouth as needed for migraine (migraine). May repeat in 2 hours if needed    Historical Provider, MD  triazolam (HALCION) 0.25 MG tablet Take 0.5 mg by mouth at bedtime.     Historical Provider, MD    Current Facility-Administered Medications  Medication Dose Route Frequency  Provider Last Rate Last Dose  . acetaminophen (TYLENOL) tablet 650 mg  650 mg Oral Q6H PRN Amy S Esterwood, PA-C       Or  . acetaminophen (TYLENOL) suppository 650 mg  650 mg Rectal Q6H PRN Amy S Esterwood, PA-C      . ALPRAZolam (XANAX) tablet 2 mg  2 mg Oral BID PRN Amy S Esterwood, PA-C      . dextrose 5 % and 0.45 % NaCl with KCl 10 mEq/L infusion   Intravenous Continuous Amy S Esterwood, PA-C      . dicyclomine (BENTYL) capsule 10 mg  10 mg Oral Q6H PRN Amy S Esterwood, PA-C      . HYDROmorphone (DILAUDID) injection 0.5 mg  0.5 mg Intravenous Q3H PRN Amy S Esterwood, PA-C      . metroNIDAZOLE (FLAGYL) tablet 250 mg  250 mg Oral 4 times per day Amy S Esterwood, PA-C      . montelukast (SINGULAIR) tablet 10 mg  10 mg Oral QHS Amy S Esterwood, PA-C      . ondansetron (ZOFRAN) tablet 4 mg  4 mg Oral Q6H PRN Amy S Esterwood, PA-C       Or  . ondansetron (ZOFRAN) injection 4 mg  4 mg Intravenous Q6H PRN Amy S Esterwood, PA-C      . oxyCODONE (Oxy IR/ROXICODONE) immediate release tablet 5 mg  5 mg Oral Q4H PRN Amy S Esterwood, PA-C      . saccharomyces boulardii (FLORASTOR) capsule 250 mg  250 mg Oral BID Amy S Esterwood, PA-C        Allergies as of 08/10/2014 - Review Complete 08/10/2014  Allergen Reaction Noted  . Penicillins  08/06/2012    Family History  Problem Relation Age of Onset  . Diabetes Father   . Diabetes Paternal Grandfather   . Diabetes Maternal Grandfather   . Colon cancer Neg Hx   . Esophageal cancer Neg Hx   . Stomach cancer Neg Hx   . Rectal cancer Neg Hx     History   Social History  . Marital Status: Married    Spouse Name: N/A    Number of Children: 1  . Years of Education: N/A   Occupational History  . SALES    Social History Main Topics  . Smoking status: Former Games developer  . Smokeless tobacco: Never Used  . Alcohol Use: Yes     Comment: 1 drink yearly  . Drug Use: No  . Sexual Activity: Yes   Other Topics Concern  . Not on file   Social  History Narrative  . No narrative on file    Review of Systems:  All systems reviewed an negative except where noted in HPI.    Physical Exam: Vital signs in last 24 hours: Temp:  [97.6 F (36.4 C)-98.6 F (37 C)] 97.6 F (36.4 C) (  10/08 1124) Pulse Rate:  [44-86] 44 (10/08 1124) Resp:  [16-18] 18 (10/08 1124) BP: (116-145)/(75-86) 145/82 mmHg (10/08 1124) SpO2:  [97 %-100 %] 100 % (10/08 1124) Weight:  [131 lb 2 oz (59.478 kg)] 131 lb 2 oz (59.478 kg) (10/08 0943)   General:  Pleasant, well-developed, white male in NAD, uncomfortable appearing holding his abdomen pale Head:  Normocephalic and atraumatic. Eyes:  Sclera clear, no icterus.   Conjunctiva pink. Ears:  Normal auditory acuity. Mouth:  No deformity or lesions.  Neck:  Supple; no masses . Lungs:  Clear throughout to auscultation.   No wheezes, crackles, or rhonchi. No acute distress. Heart:  Regular rate and rhythm; no murmurs. Abdomen:  Soft, nondistended rather diffusely tender and more marked in the right mid right lower quadrant no true guarding or rebound bowel sounds are present , no palpable mass or hepatosplenomegaly  Rectal:  Not done Msk:  Symmetrical without gross deformities.. Pulses:  Normal pulses noted. Extremities:  Without edema. Neurologic:  Alert and  oriented x4;  grossly normal neurologically. Skin:  Intact without significant lesions or rashes. Cervical Nodes:  No significant cervical adenopathy. Psych:  Alert and cooperative. Normal mood and affect.  Lab Results:  Recent Labs  08/09/14 1213  WBC 8.4  HGB 14.3  HCT 42.7  PLT 247   BMET  Recent Labs  08/09/14 1213  NA 142  K 5.0  CL 101  CO2 27  GLUCOSE 115*  BUN 12  CREATININE 0.74  CALCIUM 10.1   LFT  Recent Labs  08/09/14 1213  PROT 7.9  ALBUMIN 4.7  AST 17  ALT 12  ALKPHOS 49  BILITOT 1.2   PT/INR No results found for this basename: LABPROT, INR,  in the last 72 hours Hepatitis Panel No results found for  this basename: HEPBSAG, HCVAB, HEPAIGM, HEPBIGM,  in the last 72 hours  Studies/Results: Ct Abdomen Pelvis W Contrast  08/09/2014   CLINICAL DATA:  Abdominal pain, nausea, vomiting, and diarrhea for 3-4 days, former smoker  EXAM: CT ABDOMEN AND PELVIS WITH CONTRAST  TECHNIQUE: Multidetector CT imaging of the abdomen and pelvis was performed using the standard protocol following bolus administration of intravenous contrast. Sagittal and coronal MPR images reconstructed from axial data set.  CONTRAST:  OMNIPAQUE IOHEXOL 300 MG/ML SOLN IV. Dilute oral contrast.  COMPARISON:  08/26/2012  FINDINGS: Lung bases clear.  Liver, spleen, pancreas, kidneys, and adrenal glands normal appearance.  Wall thickening of a long segment of ascending colon and cecum favoring colitis.  Remainder of colon is normal in caliber with bowel wall thickening.  Stomach and bowel loops normal appearance.  Normal appendix and bladder with ight ureteral dilatation.  Numerous pelvic phleboliths.  No mass, adenopathy, free fluid or free air.  No hernia or acute bone lesions.  IMPRESSION: Long segment of wall thickening involving the ascending colon and cecum most likely representing colitis.  Differential diagnosis includes infection and inflammatory bowel disease, ischemia considered unlikely due to age and distribution.  Remainder of exam unremarkable.   Electronically Signed   By: Ulyses Southward M.D.   On: 08/09/2014 14:12    Impression / Plan:  #1 40 H. or old white male with progressively worsening abdominal pain, cramping diarrhea intermittent rectal bleeding weight loss nausea and vomiting x1 month. CT scan done yesterday consistent with a right-sided colitis. While this may be an infectious am concerned that with the duration of his symptoms that he has a new onset IBD. #2  chronic anxiety #3 history of Tourette's #4 chronic pain medication usage for chronic arthritis  Plan; patient is admitted to the GI service for IV fluid  hydration pain control and will plan for colonoscopy Friday, 08/11/2014 with Dr. Russella Dar for diagnostic purposes. Stool GI pathogen panel to be done as well as inflammatory markers and for  the time being will continue him on oral Flagyl. Further plans pending results of stool studies and colonoscopy.      LOS: 0 days   Amy Esterwood  08/10/2014, 12:06 PM

## 2014-08-10 NOTE — Progress Notes (Signed)
Reviewed and agree with management. Robert D. Kaplan, M.D., FACG  

## 2014-08-11 ENCOUNTER — Encounter (HOSPITAL_COMMUNITY): Payer: Self-pay

## 2014-08-11 ENCOUNTER — Encounter (HOSPITAL_COMMUNITY)
Admission: AD | Disposition: A | Discharge status: Home / Self Care | Payer: Self-pay | Source: Ambulatory Visit | Attending: Gastroenterology

## 2014-08-11 DIAGNOSIS — G8929 Other chronic pain: Secondary | ICD-10-CM

## 2014-08-11 DIAGNOSIS — R933 Abnormal findings on diagnostic imaging of other parts of digestive tract: Secondary | ICD-10-CM

## 2014-08-11 DIAGNOSIS — R109 Unspecified abdominal pain: Secondary | ICD-10-CM

## 2014-08-11 DIAGNOSIS — R197 Diarrhea, unspecified: Secondary | ICD-10-CM

## 2014-08-11 HISTORY — PX: COLONOSCOPY: SHX5424

## 2014-08-11 LAB — CLOSTRIDIUM DIFFICILE BY PCR: CDIFFPCR: NEGATIVE

## 2014-08-11 SURGERY — COLONOSCOPY
Anesthesia: Moderate Sedation

## 2014-08-11 MED ORDER — MIDAZOLAM HCL 5 MG/5ML IJ SOLN
INTRAMUSCULAR | Status: DC | PRN
  Administered 2014-08-11 (×7): 2.5 mg via INTRAVENOUS

## 2014-08-11 MED ORDER — CIPROFLOXACIN HCL 500 MG PO TABS
500.0000 mg | ORAL_TABLET | Freq: Two times a day (BID) | ORAL | Status: DC
  Administered 2014-08-11 – 2014-08-13 (×5): 500 mg via ORAL
  Filled 2014-08-11 (×7): qty 1

## 2014-08-11 MED ORDER — MIDAZOLAM HCL 10 MG/2ML IJ SOLN
INTRAMUSCULAR | Status: AC
  Filled 2014-08-11: qty 4

## 2014-08-11 MED ORDER — DICYCLOMINE HCL 10 MG PO CAPS
20.0000 mg | ORAL_CAPSULE | Freq: Three times a day (TID) | ORAL | Status: DC
  Filled 2014-08-11 (×4): qty 2

## 2014-08-11 MED ORDER — DIPHENHYDRAMINE HCL 50 MG/ML IJ SOLN
INTRAMUSCULAR | Status: DC | PRN
  Administered 2014-08-11: 50 mg via INTRAVENOUS

## 2014-08-11 MED ORDER — DICYCLOMINE HCL 10 MG PO CAPS
10.0000 mg | ORAL_CAPSULE | Freq: Three times a day (TID) | ORAL | Status: DC
  Filled 2014-08-11 (×4): qty 1

## 2014-08-11 MED ORDER — DICYCLOMINE HCL 20 MG PO TABS
20.0000 mg | ORAL_TABLET | Freq: Three times a day (TID) | ORAL | Status: DC
  Administered 2014-08-11 – 2014-08-13 (×8): 20 mg via ORAL
  Filled 2014-08-11 (×12): qty 1

## 2014-08-11 MED ORDER — FENTANYL CITRATE 0.05 MG/ML IJ SOLN
INTRAMUSCULAR | Status: DC | PRN
  Administered 2014-08-11 (×2): 25 ug via INTRAVENOUS
  Administered 2014-08-11: 50 ug via INTRAVENOUS
  Administered 2014-08-11: 25 ug via INTRAVENOUS
  Administered 2014-08-11: 50 ug via INTRAVENOUS
  Administered 2014-08-11: 25 ug via INTRAVENOUS
  Administered 2014-08-11: 50 ug via INTRAVENOUS

## 2014-08-11 MED ORDER — FENTANYL CITRATE 0.05 MG/ML IJ SOLN
INTRAMUSCULAR | Status: AC
  Filled 2014-08-11: qty 4

## 2014-08-11 MED ORDER — DIPHENHYDRAMINE HCL 50 MG/ML IJ SOLN
INTRAMUSCULAR | Status: AC
  Filled 2014-08-11: qty 1

## 2014-08-11 MED ORDER — BOOST / RESOURCE BREEZE PO LIQD
1.0000 | Freq: Three times a day (TID) | ORAL | Status: DC
  Administered 2014-08-11 (×2): 1 via ORAL
  Administered 2014-08-12: 10:00:00 via ORAL

## 2014-08-11 NOTE — Interval H&P Note (Signed)
History and Physical Interval Note:  08/11/2014 8:39 AM  Shane Curry  has presented today for surgery, with the diagnosis of diarrhea and abnl radiologic study  The various methods of treatment have been discussed with the patient and family. After consideration of risks, benefits and other options for treatment, the patient has consented to  Procedure(s): COLONOSCOPY (N/A) as a surgical intervention .  The patient's history has been reviewed, patient examined, no change in status, stable for surgery.  I have reviewed the patient's chart and labs.  Questions were answered to the patient's satisfaction.     Venita LickMalcolm T. Russella DarStark MD

## 2014-08-11 NOTE — H&P (Signed)
GI Attending Note   Chart was reviewed and patient was examined. X-rays and lab were reviewed.    I agree with management and plans.  Winford Hehn D. Jamillia Closson, M.D., FACG [facility] Cell [phone]  

## 2014-08-11 NOTE — Progress Notes (Signed)
INITIAL NUTRITION ASSESSMENT  Pt meets criteria for severe MALNUTRITION in the context of chronic illness as evidenced by 10% weight loss in the past 2 months with severe muscle wasting in temples and clavicles.  DOCUMENTATION CODES Per approved criteria  -Severe malnutrition in the context of chronic illness   INTERVENTION: - Diet advancement per MD - Resource Breeze TID - RD to continue to monitor  NUTRITION DIAGNOSIS: Inadequate oral intake related to clear liquid diet as evidenced by diet order.   Goal: Advance diet as tolerated to soft diet  Monitor:  Weights, labs, diet advancement  Reason for Assessment: Malnutrition screening tool   38 y.o. male  Admitting Dx: Abdominal pain, nausea, vomiting, diarrhea and right sided colitis on CT scan   ASSESSMENT: Pt with a several year history complaints of abdominal pain and chronic constipation now with one month history of progressing abdominal pain cramping, diarrhea, intermittent rectal bleeding, nausea and vomiting. CT scan of the abdomen and pelvis done yesterday through the emergency room consistent with an ascending colitis involving a long segment. Concern for new onset IBD. Patient states at least 10 bowel movements per day sometimes containing small amounts of bright red blood. His appetite has been poor and he is been unable to keep down any by mouth is over the past 2-3 days other than sips of water. They have not taken his temperature at home but he has been having hot and cold spells and sweats. Patient's weight is down 9 pounds over the past month.  -Pt discussed during multidisciplinary rounds -Pt vomited yesterday when he arrived to the floor -Had colonoscopy this morning which was normal with grade 1 internal hemorrhoids -Met with pt and wife who report pt has a lost a lot of weight recently, 15 pounds unintentionally in the past 2 months -Pt reports he hasn't eaten anything in 5 days with very little fluid intake  of water and soda -Has tried protein shakes and powders in the past but reports he still can't gain weight -Before 5 days ago, his appetite was great, eating more than 3 meals/day -Denies any vomiting today, c/o nausea this morning, and some diarrhea from colonoscopy prep -Reports he is active from walking at work, 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  Nutrition Focused Physical Exam:  Subcutaneous Fat:  Orbital Region: wnl Upper Arm Region: moderate wasting Thoracic and Lumbar Region: moderate wasting  Muscle:  Temple Region: severe wasting Clavicle Bone Region: severe wasting Clavicle and Acromion Bone Region: severe wasting Scapular Bone Region: wnl Dorsal Hand: wnl Patellar Region: wnl Anterior Thigh Region: wnl Posterior Calf Region: wnl  Edema: none noted   Height: Ht Readings from Last 1 Encounters:  08/11/14 5' 9" (1.753 m)    Weight: Wt Readings from Last 1 Encounters:  08/11/14 131 lb 2 oz (59.478 kg)    Ideal Body Weight: 160 lbs  % Ideal Body Weight: 82%  Wt Readings from Last 10 Encounters:  08/11/14 131 lb 2 oz (59.478 kg)  08/11/14 131 lb 2 oz (59.478 kg)  08/10/14 131 lb 2 oz (59.478 kg)  11/18/12 157 lb (71.215 kg)  08/27/12 152 lb 3.2 oz (69.037 kg)  08/18/12 151 lb (68.493 kg)  08/13/12 151 lb 12.8 oz (68.856 kg)  08/06/12 151 lb (68.493 kg)    Usual Body Weight: 146 lbs per pt  % Usual Body Weight: 90%  BMI:  Body mass index is 19.35 kg/(m^2).  Estimated Nutritional Needs: Kcal: 1800-2000 Protein:  75-95g Fluid: 1.8-2L/day   Skin: intact   Diet Order: NPO  EDUCATION NEEDS: -No education needs identified at this time   Intake/Output Summary (Last 24 hours) at 08/11/14 0943 Last data filed at 08/11/14 0717  Gross per 24 hour  Intake 2362.5 ml  Output   1075 ml  Net 1287.5 ml    Last BM: 10/9  Labs:   Recent Labs Lab 08/09/14 1213 08/10/14 1136  NA 142 138  K 5.0 4.0  CL 101 99  CO2 27 25   BUN 12 9  CREATININE 0.74 0.79  CALCIUM 10.1 9.1  GLUCOSE 115* 127*    CBG (last 3)  No results found for this basename: GLUCAP,  in the last 72 hours  Scheduled Meds: . ciprofloxacin  500 mg Oral BID  . dicyclomine  10 mg Oral TID AC & HS  . metroNIDAZOLE  250 mg Oral 4 times per day  . montelukast  10 mg Oral QHS  . ondansetron (ZOFRAN) IV  4 mg Intravenous 4 times per day   Or  . ondansetron  4 mg Oral 4 times per day  . saccharomyces boulardii  250 mg Oral BID    Continuous Infusions: . sodium chloride    . dextrose 5 % and 0.45 % NaCl with KCl 10 mEq/L 125 mL/hr at 08/10/14 2021    Past Medical History  Diagnosis Date  . Anxiety   . Allergy   . Arthritis     Past Surgical History  Procedure Laterality Date  . Knee arthroscopy Left 11/2009      MS, RD, LDN [phone] Pager [phone] Weekend/After Hours Pager  

## 2014-08-11 NOTE — Op Note (Signed)
[facility] [address] [place], [id number]   COLONOSCOPY PROCEDURE REPORT  PATIENT: Shane MorinWeinstein, [name]  MR#: [id number] BIRTHDATE: 11-Jun-1976 , 38  yrs. old GENDER: male ENDOSCOPIST: Meryl DareMalcolm T Stark, MD, [facility] PROCEDURE DATE:  08/11/2014 PROCEDURE:   Colonoscopy with biopsy First Screening Colonoscopy - Avg.  risk and is 50 yrs.  old or older - No.  Prior Negative Screening - Now for repeat screening. N/A  History of Adenoma - Now for follow-up colonoscopy & has been > or = to 3 yrs.  N/A  Polyps Removed Today? No.  Polyps Removed Today? No.  Recommend repeat exam, <10 yrs? Polyps Removed Today? No.  Recommend repeat exam, <10 yrs? No. ASA CLASS:   Class II INDICATIONS:an abnormal CT of colon, abdominal pain, and unexplained diarrhea. MEDICATIONS: Benadryl 50 mg IV, Fentanyl 250 mcg IV, and Versed 17.5 mg IV DESCRIPTION OF PROCEDURE:   After the risks benefits and alternatives of the procedure were thoroughly explained, informed consent was obtained.  The digital rectal exam revealed no abnormalities of the rectum.   The Pentax Ped Colon [id number] endoscope was introduced through the anus and advanced to the terminal ileum which was intubated for a short distance. No adverse events experienced.   The quality of the prep was good, using MoviPrep  The instrument was then slowly withdrawn as the colon was fully examined.    COLON FINDINGS: A normal appearing cecum, ileocecal valve, and appendiceal orifice were identified.  The ascending, transverse, descending, sigmoid colon, and rectum appeared unremarkable. Multiple random biopsies were performed.   The examined terminal ileum appeared to be normal.  Retroflexed views revealed internal Grade I hemorrhoids. The time to cecum=3 minutes 00 seconds. Withdrawal time=12 minutes 00 seconds.  The scope was withdrawn and the procedure completed.  COMPLICATIONS: There were no immediate complications.  ENDOSCOPIC  IMPRESSION: 1.   Normal colonoscopy; multiple random biopsies performed 2.   Normal distal terminal ileum 3.  Grade I internal hemorrhoids  RECOMMENDATIONS: 1.  Await pathology results 2.  Call to schedule a follow-up appointment with GI Clinic 2 weeks with Dr. Rhea BeltonPyrtle  eSigned:  Meryl DareMalcolm T Stark, MD, [facility] 08/11/2014 9:34 AM   cc: Erick BlinksPyrtle, Jay MD

## 2014-08-11 NOTE — H&P (View-Only) (Signed)
 Subjective:    Patient ID: Shane Curry, male    DOB: 12/17/1975, 38 y.o.   MRN: [id number]  HPI  Shane Curry is a pleasant 38-year-old white male known to Dr. Pyrtle. He had been seen in 2013 with complaints of abdominal pain and chronic constipation and had undergone a colonoscopy in October of 2013. He had a poor prep but was felt to be a normal exam. He was given a trial of limbs S. at that time. We have not seen him in the past 2 years. He has history of chronic anxiety and was diagnosed with corrects many years ago. He says he is been having ongoing GI issues but just did not come back. He says the lens asked not work for him. Now over the past couple of months with increased abdominal pain and cramping. He had an ER visit in mid September with new onset of diarrhea along with his abdominal pain. He did not have any imaging at that time and was diagnosed with a gastroenteritis. Now over the past 3 weeks his symptoms have progressed. He feels very poorly has been having intermittent chills and sweats, daily watery diarrhea with at least 10 bowel movements per day. He has been seeing intermittent bright red blood in his stools, and has been having nausea and vomiting intermittently over the past week. He says he has severe ongoing abdominal cramping and has been unable to eat. His weight is down about 9 pounds. He has not been on any recent antibiotics, no foreign travel or known exposures, no new meds etc. He went back to the emergency room yesterday 08/09/2014. At that time he had CT scan of the abdomen and pelvis with contrast which does show wall thickening of a long segment of a ascending colon and cecum consistent with a colitis ,remainder of colon normal in caliber with bowel wall thickening. WBC 8.4 hemoglobin 14.3 hematocrit of 42.7 lactate level slightly elevated at 2.25 cemented unremarkable.    Review of Systems  Constitutional: Positive for chills, appetite change, fatigue and unexpected  weight change.  HENT: Negative.   Eyes: Negative.   Respiratory: Negative.   Gastrointestinal: Positive for nausea, vomiting, abdominal pain, diarrhea and blood in stool.  Endocrine: Negative.   Genitourinary: Negative.   Musculoskeletal: Positive for arthralgias.  Skin: Negative.   Allergic/Immunologic: Negative.   Neurological: Negative.   Hematological: Negative.   Psychiatric/Behavioral: The patient is nervous/anxious.    No facility-administered medications prior to visit.   Outpatient Prescriptions Prior to Visit  Medication Sig Dispense Refill  . alprazolam (XANAX) 2 MG tablet Take 2 mg by mouth 2 (two) times daily.       . ciprofloxacin (CIPRO) 500 MG tablet Take 1 tablet (500 mg total) by mouth every 12 (twelve) hours.  28 tablet  0  . hydrocortisone (ANUSOL-HC) 2.5 % rectal cream Place 2 application rectally 2 (two) times daily as needed (hemorrhoids). Apply rectally 2 times daily prn      . ibuprofen (ADVIL,MOTRIN) 100 MG tablet Take 200 mg by mouth every 6 (six) hours as needed for fever (headache).      . magnesium hydroxide (MILK OF MAGNESIA) 400 MG/5ML suspension Take 60 mLs by mouth every 6 (six) hours as needed for mild constipation (constipation).       . metroNIDAZOLE (FLAGYL) 500 MG tablet Take 1 tablet (500 mg total) by mouth 3 (three) times daily.  42 tablet  0  . montelukast (SINGULAIR) 10 MG tablet Take 10   mg by mouth at bedtime.      Marland Kitchen. oxyCODONE (ROXICODONE) 15 MG immediate release tablet Take 15 mg by mouth every 4 (four) hours as needed for pain (knee pain).       Marland Kitchen. oxyCODONE-acetaminophen (PERCOCET/ROXICET) 5-325 MG per tablet Take 1-2 tablets by mouth every 6 (six) hours as needed for moderate pain or severe pain.  20 tablet  0  . rizatriptan (MAXALT) 10 MG tablet Take 10 mg by mouth as needed for migraine (migraine). May repeat in 2 hours if needed      . triazolam (HALCION) 0.25 MG tablet Take 0.5 mg by mouth at bedtime.        Allergies  Allergen  Reactions  . Penicillins     Childhood reaction    Patient Active Problem List   Diagnosis Date Noted  . Generalized anxiety disorder 08/10/2014  . Acute colitis 08/10/2014  . Chronic abdominal pain 08/06/2012  . Tourette's syndrome 08/06/2012  . Constipation 08/06/2012   History  Substance Use Topics  . Smoking status: Former Games developermoker  . Smokeless tobacco: Never Used  . Alcohol Use: Yes     Comment: 1 drink yearly   family history includes Diabetes in his father, maternal grandfather, and paternal grandfather. There is no history of Colon cancer, Esophageal cancer, Stomach cancer, or Rectal cancer.     Objective:   Physical Exam  well-developed thin ill-appearing white male accompanied by his wife blood pressure 118/80 pulse in the 60s height 5 foot 9 weight 131 down 10 pounds. HEENT; nontraumatic normocephalic EOMI PERRLA sclera anicteric, Supple ;no JVD, Cardiovascular; regular rate and rhythm with S1-S2 no murmur or gallop, Pulmonary; clear bilaterally, Abdomen; soft bowel sounds are present she is rather diffusely tender more so in the right lower quadrant no guarding or rebound no palpable mass or hepatosplenomegaly rectal exam not done, Extremities ;no clubbing cyanosis or edema skin warm and dry, Psych; anxious but appropriate        Assessment & Plan:  Number 1:30 H. or old white male with a several year history complaints of abdominal pain and chronic constipation now with one month history of progressing abdominal pain cramping diarrhea intermittent rectal bleeding nausea and vomiting. CT scan of the abdomen and pelvis done yesterday through the emergency room consistent with an ascending  colitis involving a long segment. While this may be infectious he has been ill for several weeks at this point and am concerned he has new onset of IBD. #2 chronic anxiety #3 history of Tourette's  Plan; patient will be admitted to the GI service for IV fluid rehydration and pain  control, will place him on oral metronidazole and hold Cipro until stool pathogen panel returns and we rule out C. Difficile. Clear liquid diet Continue usual dose of Xanax Sedimentation rate and CRP Patient will need colonoscopy during his hospital stay for diagnostic purposes.

## 2014-08-12 DIAGNOSIS — E43 Unspecified severe protein-calorie malnutrition: Secondary | ICD-10-CM | POA: Insufficient documentation

## 2014-08-12 DIAGNOSIS — F411 Generalized anxiety disorder: Secondary | ICD-10-CM

## 2014-08-12 NOTE — Progress Notes (Signed)
    Progress Note   Subjective  having stomach "contortions". Doesn't known what to do about pain, Dilaudid helps.    Objective   Vital signs in last 24 hours: Temp:  [97.7 F (36.5 C)-98.1 F (36.7 C)] 97.9 F (36.6 C) (10/10 0613) Pulse Rate:  [50-88] 55 (10/10 0613) Resp:  [16-20] 20 (10/10 0613) BP: (113-128)/(78-82) 119/82 mmHg (10/10 0613) SpO2:  [99 %-100 %] 100 % (10/10 0613) Last BM Date: 08/11/14 General:    white male in NAD Heart:  Regular rate and rhythm Lungs: Respirations even and unlabored, lungs CTA bilaterally Abdomen:  Soft, nontender and nondistended. Normal bowel sounds. Extremities:  Without edema. Neurologic:  Alert and oriented,  grossly normal neurologically. Psych:  Cooperative. Normal mood and affect.    Lab Results:  Recent Labs  08/09/14 1213 08/10/14 1136  WBC 8.4 7.3  HGB 14.3 12.8*  HCT 42.7 37.3*  PLT 247 204   BMET  Recent Labs  08/09/14 1213 08/10/14 1136  NA 142 138  K 5.0 4.0  CL 101 99  CO2 27 25  GLUCOSE 115* 127*  BUN 12 9  CREATININE 0.74 0.79  CALCIUM 10.1 9.1   LFT  Recent Labs  08/10/14 1136  PROT 6.8  ALBUMIN 4.2  AST 13  ALT 9  ALKPHOS 43  BILITOT 0.9      Assessment / Plan:   38 year old male admitted with nausea, vomiting, abdominal pain and CTscan revealing right sided colitis. Colonoscopy was unremarkable despite CTscan. Patient continues to have significant diffuse abdominal pain. He is getting IV Dilaudid, interested in getting oral form for outpatient management of pain. Long discussion with patient about pain management. He is under care of Pain Management and I explained that narcotics should be managed by them and doing otherwise could result in dismissal from practice.    LOS: 2 days   Willette Clusteraula Guenther  08/12/2014, 11:01 AM      Attending physician's note   I have taken an interval history, reviewed the chart and examined the patient. I agree with the Advanced Practitioner's note,  impression and recommendations. Diarrhea has resolved. Abd pain improved with Bentyl and Dilaudid. 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 Awaiting GI pathogen panel. Awaiting colon biopsies.  Continue empiric Cipro and Flagyl. Continue Bentyl ac and hs and Florastor bid. Advance diet to full liquids. Attempt to significantly decrease Dilaudid usage and use Oxycodone for pain. Possible discharge tomorrow  Judie PetitMalcolm T. Russella DarStark, MD [facility]

## 2014-08-13 DIAGNOSIS — E43 Unspecified severe protein-calorie malnutrition: Secondary | ICD-10-CM

## 2014-08-13 LAB — TSH: TSH: 3.57 u[IU]/mL (ref 0.350–4.500)

## 2014-08-13 MED ORDER — ONDANSETRON HCL 4 MG PO TABS: 4.0000 mg | ORAL_TABLET | Freq: Four times a day (QID) | ORAL | Status: DC

## 2014-08-13 MED ORDER — OXYCODONE-ACETAMINOPHEN 5-325 MG PO TABS: 1.0000 | ORAL_TABLET | Freq: Four times a day (QID) | ORAL | Status: DC | PRN

## 2014-08-13 MED ORDER — DICYCLOMINE HCL 20 MG PO TABS: 20.0000 mg | ORAL_TABLET | Freq: Three times a day (TID) | ORAL | Status: DC

## 2014-08-13 MED ORDER — OXYCODONE HCL 15 MG PO TABS: 15.0000 mg | ORAL_TABLET | ORAL | Status: DC | PRN

## 2014-08-13 MED ORDER — SACCHAROMYCES BOULARDII 250 MG PO CAPS: 250.0000 mg | ORAL_CAPSULE | Freq: Two times a day (BID) | ORAL | Status: DC

## 2014-08-13 MED ORDER — OXYCODONE HCL 5 MG PO TABS: 5.0000 mg | ORAL_TABLET | ORAL | Status: DC | PRN

## 2014-08-13 NOTE — Progress Notes (Signed)
     [facility] Progress Note Subjective:  Had a better night last pm, and had a loose BM this morning with no blood or mucus. Pain less severe this morning. Pain comes in spasms. Pt seems to think pain correlates with stress. Says he has been under much more stress tha usual for the past few months with a new job, financial issues, etc. Feels the more he worries about these issues, the more frequent and the more severe his pain gets. No nausea or vomiting. Tolerated full liquids and would like to advance diet.   Objective:  Vital signs in last 24 hours: Temp:  [98.1 F (36.7 C)-98.2 F (36.8 C)] 98.2 F (36.8 C) (10/11 0536) Pulse Rate:  [45-46] 45 (10/11 0536) Resp:  [16-17] 17 (10/11 0536) BP: (108-124)/(70-76) 124/70 mmHg (10/11 0536) SpO2:  [99 %-100 %] 99 % (10/11 0536) Last BM Date: 08/13/14 General:   Alert,  Well-developed male  in NAD Heart:  Regular rate and rhythm; no murmurs Pulm lungs clear to ausc bilat Abdomen:  Soft, nontender and nondistended. Normal bowel sounds, without guarding, and without rebound.   Extremities:  Without edema. Neurologic:  Alert and  oriented x4;  grossly normal neurologically. Psych:  Alert and cooperative. Very anxious.    Lab Results:  Recent Labs  08/10/14 1136  WBC 7.3  HGB 12.8*  HCT 37.3*  PLT 204   BMET  Recent Labs  08/10/14 1136  NA 138  K 4.0  CL 99  CO2 25  GLUCOSE 127*  BUN 9  CREATININE 0.79  CALCIUM 9.1   LFT  Recent Labs  08/10/14 1136  PROT 6.8  ALBUMIN 4.2  AST 13  ALT 9  ALKPHOS 43  BILITOT 0.9     ASSESSMENT/PLAN:  38  yo male admitted with N/V/D and CT scan showing right sided colitis. Colonoscopy unremarkable. GI pathogen panel pending. Colon biopsies pending. Continue bentyl, cipro, flagyl, florastor. Possible discharge later today if tolerates diet.     LOS: 3 days   Hvozdovic, Moise BoringLori P PA-C 08/13/2014, Pager [phone]     Attending physician's note   I have taken  an interval history, reviewed the chart and examined the patient. I agree with the Advanced Practitioner's note, impression and recommendations.  Much improved but still having intermittent crampy abdominal pain and mild diarrhea with soft diet. R/O hyperthyroidism and celiac disease. GI pathogen panel is still pending.  Discharge home today on Bentyl, Florastor and an empric course of Cipro, Flagyl and a limited supply of oxycodone-enough to get to his pain clinic appt on Wednesday. I have made it clear that our practice will not prescribe him any additional opioids or anxiolytics as an outpatient as he is managed at a pain clinic. Diet restrictions: lactose free, low fat, low fiber, minimal caffeine and no sodas for now.   Venita LickMalcolm T. Russella DarStark, MD [facility]

## 2014-08-13 NOTE — Discharge Summary (Signed)
[facility] Discharge Summary  Name: Shane Curry MRN: [id number] DOB: 02-May-1976 38 y.o. PCP:  Shane Curry, Shane WARREN, MD  Date of Admission: 08/10/2014 11:06 AM Date of Discharge: 08/13/2014 Primary Gastroenterologist:Shane Curry Discharging Physician:Shane Curry  Discharge Diagnosis: Abdominal pain,nausea, vomiting, diarrhea  Consultations:none  Procedures Performed:  Ct Abdomen Pelvis W Contrast  08/09/2014   CLINICAL DATA:  Abdominal pain, nausea, vomiting, and diarrhea for 3-4 days, former smoker  EXAM: CT ABDOMEN AND PELVIS WITH CONTRAST  TECHNIQUE: Multidetector CT imaging of the abdomen and pelvis was performed using the standard protocol following bolus administration of intravenous contrast. Sagittal and coronal MPR images reconstructed from axial data set.  CONTRAST:  100mL OMNIPAQUE IOHEXOL 300 MG/ML SOLN IV. Dilute oral contrast.  COMPARISON:  08/26/2012  FINDINGS: Lung bases clear.  Liver, spleen, pancreas, kidneys, and adrenal glands normal appearance.  Wall thickening of a long segment of ascending colon and cecum favoring colitis.  Remainder of colon is normal in caliber with bowel wall thickening.  Stomach and bowel loops normal appearance.  Normal appendix and bladder with ight ureteral dilatation.  Numerous pelvic phleboliths.  No mass, adenopathy, free fluid or free air.  No hernia or acute bone lesions.  IMPRESSION: Long segment of wall thickening involving the ascending colon and cecum most likely representing colitis.  Differential diagnosis includes infection and inflammatory bowel disease, ischemia considered unlikely due to age and distribution.  Remainder of exam unremarkable.   Electronically Signed   By: Shane Curry  Shane M.D.   On: 08/09/2014 14:12    GI Procedures:Colonoscopy 08/11/14   History/Physical Exam:  See Admission H&P  Admission HPI:per Shane Myrtie Hawksterwood, PA-C on 08/10/14: Shane Curry is a pleasant 38 year old white male known to Shane. Rhea Curry. He had been seen  in 2013 with complaints of abdominal pain and chronic constipation and had undergone a colonoscopy in October of 2013. He had a poor prep but was felt to be a normal exam. He was given a trial of limbs S. at that time. We have not seen him in the past 2 years. He has history of chronic anxiety and was diagnosed with corrects many years ago. He says he is been having ongoing GI issues but just did not come back. He says the lens asked not work for him.  Now over the past couple of months with increased abdominal pain and cramping. He had an ER visit in mid September with new onset of diarrhea along with his abdominal pain. He did not have any imaging at that time and was diagnosed with a gastroenteritis. Now over the past 3 weeks his symptoms have progressed. He feels very poorly has been having intermittent chills and sweats, daily watery diarrhea with at least 10 bowel movements per day. He has been seeing intermittent bright red blood in his stools, and has been having nausea and vomiting intermittently over the past week. He says he has severe ongoing abdominal cramping and has been unable to eat. His weight is down about 9 pounds. He has not been on any recent antibiotics, no foreign travel or known exposures, no new meds etc.  He went back to the emergency room yesterday 08/09/2014. At that time he had CT scan of the abdomen and pelvis with contrast which does show wall thickening of a long segment of a ascending colon and cecum consistent with a colitis ,remainder of colon normal in caliber with bowel wall thickening.  WBC 8.4 hemoglobin 14.3 hematocrit of 42.7 lactate level slightly elevated at 2.25  cemented unremarkable   Hospital Course by problem list:  1. Abdominal apin, nausea, vomiting diarrhea in the setting of a CT scan suggesting colitis. Pt was admitted to hospital and started on IVF with bowel rest. He underwent a colonoscopy on 08/11/14 that was a normal colonoscopy aside for grade 1  internal hemorrhoids. Pt conitinued to complain of pain and cramping and was treated with IV cipro and flagyl, IV dilaudid, po oxycodone, and bentyl. Pt seemed to exhibit a high tolerance for pain medication, and time was spent encouraging pt to decrease his pain med usage so as not to jeopardize his contract that he has with pain management. His diet was lowly advance. CBC, CRP,, CMET were all normal. Stool for c diff was negative.His diet was slowly advance, and he was discharged home 10/11. Discharge Vitals:  BP 124/70  Pulse 45  Temp(Src) 98.2 F (36.8 C) (Oral)  Resp 17  Ht 5\' 9"  (1.753 m)  Wt 131 lb 2 oz (59.478 kg)  BMI 19.35 kg/m2  SpO2 99%  Physical Exam  General: Alert, Well-developed male in NAD  Heart: Regular rate and rhythm; no murmurs  Pulm lungs clear to ausc bilat  Abdomen: Soft, nontender and nondistended. Normal bowel sounds, without guarding, and without rebound.  Extremities: Without edema.  Neurologic: Alert and oriented x4; grossly normal neurologically.  Psych: Alert and cooperative. Normal mood and affect.   Disposition and follow-up:   [name]    Follow-up Appointments:Pt has appt to follow up with Shane GipAmy Esterwood, PA-C on Oct 27 at 8:30.     Discharge Instructions   Discharge instructions    Complete by:  As directed   Resume your cipro and flagyl to complete the course your were prescribed in ER. Call your Pain Management physician Monday morning and notify them of the meds you were discharged on so as not to jeopardize your pain management contract. You have an appt to follow up with Shane GipAmy Esterwood, PA-C, at [facility] Oct [facility] at 8:30. IN the meantime, please follow a low residue ( low fiber) , low fat diet, with no milk or dairy, no caffeine, and no soda. Avoid artificial sweeteners.  You are taking narcotics for pain. Be aware these meds can cause drowsiness or dizziness and use  caution driving or operating machinery. Avoid use of alcohol while on these medications.     Driving Restrictions    Complete by:  As directed      Lifting restrictions    Complete by:  As directed      Resume previous diet    Complete by:  As directed            Discharge Medications:   Medication List    STOP taking these medications       ibuprofen 100 MG tablet  Commonly known as:  ADVIL,MOTRIN     magnesium hydroxide 400 MG/5ML suspension  Commonly known as:  MILK OF MAGNESIA      TAKE these medications       alprazolam 2 MG tablet  Commonly known as:  XANAX  Take 1-2 mg by mouth 2 (two) times daily.     ciprofloxacin 500 MG tablet  Commonly known as:  CIPRO  Take 1 tablet (500 mg total) by mouth every 12 (twelve) hours.     dicyclomine 20 MG tablet  Commonly known as:  BENTYL  Take 1 tablet (20 mg total)  by mouth 4 (four) times daily -  before meals and at bedtime.     hydrocortisone 2.5 % rectal cream  Commonly known as:  ANUSOL-HC  Place 2 application rectally 2 (two) times daily as needed (hemorrhoids). Apply rectally 2 times daily prn     MAXALT 10 MG tablet  Generic drug:  rizatriptan  Take 10 mg by mouth as needed for migraine (migraine). May repeat in 2 hours if needed     metroNIDAZOLE 500 MG tablet  Commonly known as:  FLAGYL  Take 1 tablet (500 mg total) by mouth 3 (three) times daily.     montelukast 10 MG tablet  Commonly known as:  SINGULAIR  Take 10 mg by mouth at bedtime.     ondansetron 4 MG tablet  Commonly known as:  ZOFRAN  Take 1 tablet (4 mg total) by mouth every 6 (six) hours.     oxyCODONE 15 MG immediate release tablet  Commonly known as:  ROXICODONE  Take 1 tablet (15 mg total) by mouth every 4 (four) hours as needed for pain.     oxyCODONE-acetaminophen 5-325 MG per tablet  Commonly known as:  PERCOCET/ROXICET  Take 1-2 tablets by mouth every 6 (six) hours as needed for moderate pain or severe pain.      oxyCODONE-acetaminophen 5-325 MG per tablet  Commonly known as:  ROXICET  Take 1-2 tablets by mouth every 6 (six) hours as needed for severe pain.     saccharomyces boulardii 250 MG capsule  Commonly known as:  FLORASTOR  Take 1 capsule (250 mg total) by mouth 2 (two) times daily.     triazolam 0.25 MG tablet  Commonly known as:  HALCION  Take 0.5 mg by mouth at bedtime.        Signed: Hvozdovic, Tollie Pizza PA-C 08/13/2014, 11:28 AM

## 2014-08-13 NOTE — Progress Notes (Signed)
CARE MANAGEMENT NOTE 08/13/2014  Patient:  Altan Kraai MorinWEINSTEIN,Abhay M   Account Number:  [id number]  Date Initiated:  08/11/2014  Documentation initiated by:  Ezekiel InaMcGIBBONEY,COOKIE  Subjective/Objective Assessment:   pt admitted with cco abd pain, N,V, D     Action/Plan:   from home   Anticipated DC Date:  08/14/2014   Anticipated DC Plan:  HOME/SELF CARE      DC Planning Services  CM consult      Choice offered to / List presented to:             Status of service:  Completed, signed off Medicare Important Message given?   (If response is "NO", the following Medicare IM given date fields will be blank) Date Medicare IM given:   Medicare IM given by:   Date Additional Medicare IM given:   Additional Medicare IM given by:    Discharge Disposition:  HOME/SELF CARE  Per UR Regulation:  Reviewed for med. necessity/level of care/duration of stay  If discussed at Long Length of Stay Meetings, dates discussed:    Comments:  08/13/2014 1730 No needs identified. Isidoro DonningAlesia Zelma Snead RN CCM Case Mgmt phone [phone]  08/11/14 MMcGibboney, RN, BSN UR review completed.

## 2014-08-14 ENCOUNTER — Encounter: Payer: Self-pay | Admitting: Gastroenterology

## 2014-08-14 ENCOUNTER — Encounter (HOSPITAL_COMMUNITY): Payer: Self-pay | Admitting: Gastroenterology

## 2014-08-15 LAB — RETICULIN ANTIBODIES, IGA W TITER: Reticulin Ab, IgA: NEGATIVE

## 2014-08-15 LAB — GLIADIN ANTIBODIES, SERUM
Gliadin IgA: 17.8 U/mL (ref <20)
Gliadin IgG: 15.4 U/mL (ref <20)

## 2014-08-15 LAB — TISSUE TRANSGLUTAMINASE, IGA: Tissue Transglutaminase Ab, IgA: 8.2 U/mL (ref <20)

## 2014-08-29 ENCOUNTER — Encounter: Payer: Self-pay | Admitting: Physician Assistant

## 2014-08-29 ENCOUNTER — Ambulatory Visit (INDEPENDENT_AMBULATORY_CARE_PROVIDER_SITE_OTHER): Payer: Self-pay | Admitting: Physician Assistant

## 2014-08-29 VITALS — BP 104/60 | HR 60 | Ht 69.0 in | Wt 126.4 lb

## 2014-08-29 DIAGNOSIS — R1084 Generalized abdominal pain: Secondary | ICD-10-CM

## 2014-08-29 DIAGNOSIS — R1110 Vomiting, unspecified: Secondary | ICD-10-CM

## 2014-08-29 MED ORDER — OMEPRAZOLE 20 MG PO CPDR: 20.0000 mg | DELAYED_RELEASE_CAPSULE | Freq: Every day | ORAL | Status: DC

## 2014-08-29 MED ORDER — CYCLOBENZAPRINE HCL 5 MG PO TABS: 5.0000 mg | ORAL_TABLET | Freq: Three times a day (TID) | ORAL | Status: DC | PRN

## 2014-08-29 NOTE — Progress Notes (Addendum)
Subjective:    Patient ID: Shane Curry, male    DOB: [birth year], 38 y.o.   MRN: [id number]  HPI Shane Curry is a 38 year old white male known to Dr. Rhea BeltonPyrtle, with prior history of what was felt to be IBS. He had undergone a colonoscopy in 2013 for complaints of chronic constipation. He has history of chronic anxiety and history of Tourettes which he says he was diagnosed with as a teenager.  He came in to the office on 08/13/2014 stating that he been having progressive problems with abdominal pain ,cramping, diarrhea and nausea and vomiting. At that time he complained of having up to 10 bowel movements per day and seeing intermittent bright red blood in his stools. His weight was down about 9 pounds from his baseline. He had had an ER visit on 08/09/2014 - CT of the abdomen and pelvis at that time was done with contrast showing wall thickening of a long segment of the ascending colon and cecum consistent with colitis. Patient was having diarrhea and actually actively vomiting in the office that day and was therefore admitted for further evaluation. He had GI pathogen panel done done which was negative. He has completed an empiric course of Flagyl. He also underwent complete colonoscopy during his hospitalization and this was normal. Random biopsies were taken to rule out a microscopic colitis and these also showed no significant inflammation.  He comes into the office today for follow-up stating that he really does not feel any better and that he had been out of work all of last week. He states he has a new job which he really hasn't been too because he is been too sick. He mentions that he needs to apply for temporary disability because he currently has no insurance and has depleted all of his fundus. He brings with him a letter from his neurologist to he apparently sees in [place] twice yearly. This letter dated June 2015 states that patient has chronic insomnia anxiety and episodes of severe abdominal pain  nausea and vomiting which he feels may be related to a migraine complex. He suggested the patient may need to apply for disability. He says he is unable to work currently primarily because of abdominal pain. When asked about headaches he says he gets frequent headaches and uses Maxalt for these. He cannot specifically tell me that his abdominal pain has any relation to the headaches however he says at this point his abdominal pain is constant and not episodic. He describes it as a pulling, burning type feeling in his entire abdomen primarily in the upper abdomen. He says he is unable to relax his abdomen and it feels sore all over. He continues to vomit intermittently and says he is having loose stools. He is also narcotic dependent and is still taking narcotics for knee pain after he had a meniscal tear about a year and a half ago. He says he has pain in his other knee currently and should have surgery but hasn't done it because he has been to ill with GI issues. He does see a pain management physician [facility]. Recent TSH was within normal limits and celiac testing was negative, he has not had EGD    Review of Systems  Constitutional: Positive for unexpected weight change.  HENT: Negative.   Eyes: Negative.   Respiratory: Negative.   Cardiovascular: Negative.   Gastrointestinal: Positive for nausea, vomiting and abdominal pain.  Endocrine: Negative.   Genitourinary: Negative.   Musculoskeletal:  Positive for arthralgias.  Skin: Negative.   Allergic/Immunologic: Negative.   Neurological: Negative.   Hematological: Negative.   Psychiatric/Behavioral: The patient is nervous/anxious.    Outpatient Prescriptions Prior to Visit  Medication Sig Dispense Refill  . alprazolam (XANAX) 2 MG tablet Take 1-2 mg by mouth 2 (two) times daily.       Marland Kitchen. dicyclomine (BENTYL) 20 MG tablet Take 1 tablet (20 mg total) by mouth 4 (four) times daily -  before meals and at bedtime.  90 tablet  1  .  hydrocortisone (ANUSOL-HC) 2.5 % rectal cream Place 2 application rectally 2 (two) times daily as needed (hemorrhoids). Apply rectally 2 times daily prn      . montelukast (SINGULAIR) 10 MG tablet Take 10 mg by mouth at bedtime.      . ondansetron (ZOFRAN) 4 MG tablet Take 1 tablet (4 mg total) by mouth every 6 (six) hours.  20 tablet  0  . oxyCODONE (ROXICODONE) 15 MG immediate release tablet Take 1 tablet (15 mg total) by mouth every 4 (four) hours as needed for pain.  18 tablet  0  . rizatriptan (MAXALT) 10 MG tablet Take 10 mg by mouth as needed for migraine (migraine). May repeat in 2 hours if needed      . triazolam (HALCION) 0.25 MG tablet Take 0.5 mg by mouth at bedtime.       . ciprofloxacin (CIPRO) 500 MG tablet Take 1 tablet (500 mg total) by mouth every 12 (twelve) hours.  28 tablet  0  . metroNIDAZOLE (FLAGYL) 500 MG tablet Take 1 tablet (500 mg total) by mouth 3 (three) times daily.  42 tablet  0  . oxyCODONE-acetaminophen (PERCOCET/ROXICET) 5-325 MG per tablet Take 1-2 tablets by mouth every 6 (six) hours as needed for moderate pain or severe pain.  20 tablet  0  . oxyCODONE-acetaminophen (ROXICET) 5-325 MG per tablet Take 1-2 tablets by mouth every 6 (six) hours as needed for severe pain.  24 tablet  0  . saccharomyces boulardii (FLORASTOR) 250 MG capsule Take 1 capsule (250 mg total) by mouth 2 (two) times daily.  60 capsule  0   No facility-administered medications prior to visit.   Allergies  Allergen Reactions  . Penicillins     Childhood reaction     History  Substance Use Topics  . Smoking status: Former Smoker    Types: Cigarettes    Quit date: 04/10/2009  . Smokeless tobacco: Never Used  . Alcohol Use: No   family history includes Diabetes in his father, maternal grandfather, and paternal grandfather. There is no history of Colon cancer, Esophageal cancer, Stomach cancer, or Rectal cancer.     Objective:   Physical Exam well-developed thin anxious appearing  white male in no acute distress blood pressure 104/60 pulse 60 height 5 foot 9 weight 126 down 5 pounds. HEENT; nontraumatic normocephalic EOMI PERRLA sclera anicteric, Supple ;no JVD, Cardiovascular; regular rate and rhythm with S1-S2 no murmur or gallop, Pulm; clear bilaterally, Abdomen; question voluntary muscle guarding is rather diffusely tender to light palpation no focal tenderness no rebound, Bowel sounds are present, Rectal exam not done, Extremities; no clubbing cyanosis or edema skin warm and dry, Psych; affect flat otherwise appropriate        Assessment & Plan:  #741  38 year old white male with fairly chronic abdominal pain which has a muscular component. Negative GI workup to date including colonoscopy, negative biopsies for microscopic colitis, negative celiac panel and negative CT of  the abdomen and pelvis. Patient feels and capacity stated by this pain which is quite unusual for IBS and and not convinced that this time that his GI symptoms represent any underlying GI pathology. 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 Cannot rule out migraine variant #2 weight loss secondary to above #3 history of Tourette's #4 chronic narcotic use-followed at pain management  Plan; start Prilosec 20 mg by mouth daily We'll start a short trial of Flexeril 10 mg- 3 times daily as needed for abdominal pain/spasm to see if this offers any benefit Continue probiotic daily Check sedimentation rate, CRP and ANA Schedule for EGD with Dr. Rhea Belton -to help complete GI workup Schedule for neurology evaluation-rule out migraine complex with abdominal pain or other neurologic disorder Think he will benefit from a psychiatric consultation as well and if EGD is negative he will be referred. He was given a note to cover him for work last week and this week. I told him we generally do not get involved with   disability and that this is unusual for chronic abdominal pain- with no definite or organic etiology as yet uncovered. Will discuss with Dr. Rhea Belton.  Addendum: Reviewed and agree with management. Difficult to determine source for pain though I do not expect that this is solely a GI problem. Agree with neurology evaluation and psychology/psychiatry consultation. Excellent summary above and agree with plan Beverley Fiedler, MD

## 2014-08-29 NOTE — Patient Instructions (Addendum)
You have been scheduled for an endoscopy. Please follow written instructions given to you at your visit today. If you use inhalers (even only as needed), please bring them with you on the day of your procedure. Your physician has requested that you go to www.startemmi.com and enter the access code given to you at your visit today. This web site gives a general overview about your procedure. However, you should still follow specific instructions given to you by our office regarding your preparation for the procedure.  We have sent the following medications to your pharmacy for you to pick up at your convenience: Prilosec 20 mg, please take one capsule by mouth thirty minutes before breakfast  Flexeril 5 mg, please take one tablet by mouth three times daily as needed for abdominal pain  Your physician has requested that you go to the basement for the following lab work before leaving today: CRP ANA SEDIMENTATION RATE   A referral was sent to [facility] they will contact you with your appointment. [facility] phone number and address is below:  [address], [place], [place] [address] [phone]  You are scheduled for a follow up visit with Dr. Rhea BeltonPyrtle for 10-24-2014 at 2:45 pm

## 2014-09-01 ENCOUNTER — Ambulatory Visit (AMBULATORY_SURGERY_CENTER): Payer: Self-pay | Admitting: Internal Medicine

## 2014-09-01 ENCOUNTER — Encounter: Payer: Self-pay | Admitting: Internal Medicine

## 2014-09-01 VITALS — BP 139/99 | HR 82 | Temp 98.7°F | Resp 12 | Ht 69.0 in | Wt 126.0 lb

## 2014-09-01 DIAGNOSIS — R109 Unspecified abdominal pain: Secondary | ICD-10-CM

## 2014-09-01 DIAGNOSIS — R634 Abnormal weight loss: Secondary | ICD-10-CM

## 2014-09-01 DIAGNOSIS — R1084 Generalized abdominal pain: Secondary | ICD-10-CM

## 2014-09-01 DIAGNOSIS — K3184 Gastroparesis: Secondary | ICD-10-CM

## 2014-09-01 DIAGNOSIS — B9681 Helicobacter pylori [H. pylori] as the cause of diseases classified elsewhere: Secondary | ICD-10-CM

## 2014-09-01 DIAGNOSIS — R112 Nausea with vomiting, unspecified: Secondary | ICD-10-CM

## 2014-09-01 MED ORDER — SODIUM CHLORIDE 0.9 % IV SOLN: 500.0000 mL | INTRAVENOUS | Status: DC

## 2014-09-01 MED ORDER — METOCLOPRAMIDE HCL 5 MG PO TABS: 10.0000 mg | ORAL_TABLET | Freq: Three times a day (TID) | ORAL | Status: DC

## 2014-09-01 MED ORDER — METOCLOPRAMIDE HCL 10 MG PO TABS: 10.0000 mg | ORAL_TABLET | Freq: Three times a day (TID) | ORAL | Status: DC

## 2014-09-01 NOTE — Op Note (Signed)
[facility] [address] [place], [id number]   ENDOSCOPY PROCEDURE REPORT  PATIENT: Shane Curry, Shane Curry  MR#: [id number] BIRTHDATE: [birth year] , 38  yrs. old GENDER: male ENDOSCOPIST: Beverley FiedlerJay Curry Javiel Canepa, MD PROCEDURE DATE:  09/01/2014 PROCEDURE:  EGD w/ biopsy ASA CLASS:     Class II INDICATIONS:  abdominal pain, nausea, vomiting, and weight loss. MEDICATIONS: Monitored anesthesia care and Propofol 200 mg IV TOPICAL ANESTHETIC: none  DESCRIPTION OF PROCEDURE: After the risks benefits and alternatives of the procedure were thoroughly explained, informed consent was obtained.  The LB [id number] endoscope was introduced through the mouth and advanced to the second portion of the duodenum , Without limitations.  The instrument was slowly withdrawn as the mucosa was fully examined.  ESOPHAGUS: The mucosa of the esophagus appeared normal.  STOMACH: There was a large amount of residual food seen in the gastric body, gastric fundus, and cardia.  Based on this, I suspect the patient has some level of gastroparesis, in part if not completely related to narcotic pain medication.  Visualization of the gastric mucosa in the gastric body, fundus and cardia was most completely obscured by food. The mucosa of the stomach appeared normal at the incisura and antrum.  Cold forcep biopsies were taken at the antrum and angularis to evaluate for h.  pylori.  DUODENUM: The duodenal mucosa showed no abnormalities in the bulb and 2nd part of the duodenum.  Cold forceps biopsies were taken in the bulb and second portion.  Retroflexed views revealed obscured by food, no hiatus hernia seen. The scope was then withdrawn from the patient and the procedure completed.  COMPLICATIONS: There were no immediate complications.    ENDOSCOPIC IMPRESSION: 1.   The mucosa of the esophagus appeared normal 2.   Significant obtained food material in the gastric body, gastric fundus, and cardia  consistent with gastroparesis 3.   The mucosa of the stomach appeared normal 4.   The duodenal mucosa showed no abnormalities in the bulb and 2nd part of the duodenum cold forceps biopsies were taken in the bulb and second portion  RECOMMENDATIONS: 1.  Begin metoclopramide 10 mg 3 times daily before meals and at bedtime 2.  Await biopsy results 3.  Continue taking your PPI (antiacid medicine) once daily.  It is best to be taken 20-30 minutes prior to breakfast meal. 4.  Limit narcotics if possible given gastroparesis   eSigned:  Beverley FiedlerJay Curry Ruhaan Nordahl, MD 09/01/2014 9:45 AM    CC:The Patient and Robert Bellowhris Guest, MD  PATIENT NAME:  Shane Curry, Shane Curry MR#: [id number]

## 2014-09-01 NOTE — Progress Notes (Signed)
Patient awakening,vss,report to rn 

## 2014-09-01 NOTE — Progress Notes (Signed)
Called to room to assist during endoscopic procedure.  Patient ID and intended procedure confirmed with present staff. Received instructions for my participation in the procedure from the performing physician.  

## 2014-09-01 NOTE — Patient Instructions (Addendum)

## 2014-09-04 ENCOUNTER — Other Ambulatory Visit: Payer: Self-pay | Admitting: Physician Assistant

## 2014-09-04 ENCOUNTER — Telehealth: Payer: Self-pay | Admitting: *Deleted

## 2014-09-04 NOTE — Telephone Encounter (Signed)
  Follow up Call-  Call back number 09/01/2014 08/18/2012  Post procedure Call Back phone  # [phone]  Permission to leave phone message Yes Yes     Patient questions:  Do you have a fever, pain , or abdominal swelling? No. Pain Score  0 *  Have you tolerated food without any problems? Yes.    Have you been able to return to your normal activities? Yes.    Do you have any questions about your discharge instructions: Diet   No. Medications  No. Follow up visit  No.  Do you have questions or concerns about your Care? No.  Actions: * If pain score is 4 or above: No action needed, pain <4.

## 2014-09-06 ENCOUNTER — Encounter: Payer: Self-pay | Admitting: Internal Medicine

## 2014-09-07 ENCOUNTER — Other Ambulatory Visit: Payer: Self-pay

## 2014-09-07 MED ORDER — BIS SUBCIT-METRONID-TETRACYC 140-125-125 MG PO CAPS: 3.0000 | ORAL_CAPSULE | Freq: Three times a day (TID) | ORAL | Status: DC

## 2014-09-08 ENCOUNTER — Telehealth: Payer: Self-pay | Admitting: Internal Medicine

## 2014-09-08 NOTE — Telephone Encounter (Signed)
Dr Pyrtle Please advise  

## 2014-09-08 NOTE — Telephone Encounter (Signed)
Left message for Octavia Brucknerndrew Cheek at [phone] to get Pylera sample.

## 2014-09-08 NOTE — Telephone Encounter (Signed)
Please contact Pylera drug rep to see if we can get voucher or sample prescription He has penicillin allergy precluding Prevpac

## 2014-09-12 NOTE — Telephone Encounter (Signed)
1) I have spoken to [facility] pharmacy regarding patient's reglan rx. They state that patient already picked this up. He later called back to tell me that he mixed up his medications. He actually needs refill on flexeril. We sent #30 on 09/04/14 (tid prn dosing). Shall I refill?  2) I have spoken to [facility] who states that they no longer provide pylera samples or free vouchers. They have a coupon that will save patient 50 dollars after he pays 25 dollars. Could we try omeprazole 40 BID, metronidazole 250 QID and clarithyromycin 500 mg BID x 14 days (90% eradication rate)???

## 2014-09-14 NOTE — Telephone Encounter (Signed)
Pt is calling back about his med.  Advised Pt Dr. Rhea BeltonPyrtle is out of the office until Monday.  And Pt needs a refill on his Flexeril

## 2014-09-18 MED ORDER — METRONIDAZOLE 250 MG PO TABS: 250.0000 mg | ORAL_TABLET | Freq: Four times a day (QID) | ORAL | Status: DC

## 2014-09-18 MED ORDER — CLARITHROMYCIN 500 MG PO TABS: 500.0000 mg | ORAL_TABLET | Freq: Two times a day (BID) | ORAL | Status: DC

## 2014-09-18 NOTE — Telephone Encounter (Signed)
I have spoken to patient to advise of Dr Lauro FranklinPyrtle's recommendations. He verbalizes understanding. I have sent metronidazole and clarithromycin to pharmacy for patient. He states that he already has the omeprazole.

## 2014-09-18 NOTE — Telephone Encounter (Signed)
Flexeril needs to be refilled by his primary care doctor. This is not from me and will not help gastroparesis or H. Pylori gastritis Okay for omeprazole 40 mg twice daily metronidazole 250 4 times daily and clarithromycin 500 twice daily for 14 days for H. pylori

## 2014-09-30 ENCOUNTER — Observation Stay (HOSPITAL_COMMUNITY): Payer: Medicaid Other

## 2014-09-30 ENCOUNTER — Inpatient Hospital Stay (HOSPITAL_COMMUNITY)
Admission: EM | Admit: 2014-09-30 | Discharge: 2014-10-06 | DRG: 391 | Disposition: A | Discharge status: Home / Self Care | Payer: Medicaid Other | Attending: Internal Medicine | Admitting: Internal Medicine

## 2014-09-30 ENCOUNTER — Encounter (HOSPITAL_COMMUNITY): Payer: Self-pay | Admitting: *Deleted

## 2014-09-30 DIAGNOSIS — F952 Tourette's disorder: Secondary | ICD-10-CM | POA: Diagnosis present

## 2014-09-30 DIAGNOSIS — M1990 Unspecified osteoarthritis, unspecified site: Secondary | ICD-10-CM | POA: Diagnosis present

## 2014-09-30 DIAGNOSIS — R1013 Epigastric pain: Secondary | ICD-10-CM

## 2014-09-30 DIAGNOSIS — B9681 Helicobacter pylori [H. pylori] as the cause of diseases classified elsewhere: Secondary | ICD-10-CM | POA: Diagnosis present

## 2014-09-30 DIAGNOSIS — K3184 Gastroparesis: Principal | ICD-10-CM | POA: Diagnosis present

## 2014-09-30 DIAGNOSIS — K529 Noninfective gastroenteritis and colitis, unspecified: Secondary | ICD-10-CM | POA: Diagnosis present

## 2014-09-30 DIAGNOSIS — A048 Other specified bacterial intestinal infections: Secondary | ICD-10-CM

## 2014-09-30 DIAGNOSIS — G894 Chronic pain syndrome: Secondary | ICD-10-CM

## 2014-09-30 DIAGNOSIS — Z87891 Personal history of nicotine dependence: Secondary | ICD-10-CM

## 2014-09-30 DIAGNOSIS — G4700 Insomnia, unspecified: Secondary | ICD-10-CM | POA: Diagnosis present

## 2014-09-30 DIAGNOSIS — Z79899 Other long term (current) drug therapy: Secondary | ICD-10-CM

## 2014-09-30 DIAGNOSIS — R112 Nausea with vomiting, unspecified: Secondary | ICD-10-CM

## 2014-09-30 DIAGNOSIS — Z9119 Patient's noncompliance with other medical treatment and regimen: Secondary | ICD-10-CM | POA: Diagnosis present

## 2014-09-30 DIAGNOSIS — R1084 Generalized abdominal pain: Secondary | ICD-10-CM | POA: Insufficient documentation

## 2014-09-30 DIAGNOSIS — F419 Anxiety disorder, unspecified: Secondary | ICD-10-CM

## 2014-09-30 DIAGNOSIS — Z596 Low income: Secondary | ICD-10-CM

## 2014-09-30 DIAGNOSIS — E43 Unspecified severe protein-calorie malnutrition: Secondary | ICD-10-CM | POA: Diagnosis present

## 2014-09-30 LAB — COMPREHENSIVE METABOLIC PANEL
ALBUMIN: 4.5 g/dL (ref 3.5–5.2)
ALK PHOS: 54 U/L (ref 39–117)
ALT: 20 U/L (ref 0–53)
AST: 16 U/L (ref 0–37)
Anion gap: 14 (ref 5–15)
BUN: 16 mg/dL (ref 6–23)
CO2: 26 mEq/L (ref 19–32)
Calcium: 10 mg/dL (ref 8.4–10.5)
Chloride: 100 mEq/L (ref 96–112)
Creatinine, Ser: 0.79 mg/dL (ref 0.50–1.35)
GFR calc Af Amer: >90 mL/min (ref >90)
GFR calc non Af Amer: >90 mL/min (ref >90)
Glucose, Bld: 106 mg/dL — ABNORMAL HIGH (ref 70–99)
POTASSIUM: 3.8 meq/L (ref 3.7–5.3)
Sodium: 140 mEq/L (ref 137–147)
TOTAL PROTEIN: 7.8 g/dL (ref 6.0–8.3)
Total Bilirubin: 1 mg/dL (ref 0.3–1.2)

## 2014-09-30 LAB — CBC WITH DIFFERENTIAL/PLATELET
BASOS ABS: 0 10*3/uL (ref 0.0–0.1)
BASOS PCT: 0 % (ref 0–1)
Eosinophils Absolute: 0 10*3/uL (ref 0.0–0.7)
Eosinophils Relative: 0 % (ref 0–5)
HCT: 42.1 % (ref 39.0–52.0)
Hemoglobin: 14.1 g/dL (ref 13.0–17.0)
Lymphocytes Relative: 15 % (ref 12–46)
Lymphs Abs: 1.1 10*3/uL (ref 0.7–4.0)
MCH: 28.7 pg (ref 26.0–34.0)
MCHC: 33.5 g/dL (ref 30.0–36.0)
MCV: 85.7 fL (ref 78.0–100.0)
MONOS PCT: 5 % (ref 3–12)
Monocytes Absolute: 0.4 10*3/uL (ref 0.1–1.0)
NEUTROS PCT: 80 % — AB (ref 43–77)
Neutro Abs: 5.4 10*3/uL (ref 1.7–7.7)
Platelets: 175 10*3/uL (ref 150–400)
RBC: 4.91 MIL/uL (ref 4.22–5.81)
RDW: 12.5 % (ref 11.5–15.5)
WBC: 6.9 10*3/uL (ref 4.0–10.5)

## 2014-09-30 LAB — LIPASE, BLOOD: LIPASE: 24 U/L (ref 11–59)

## 2014-09-30 MED ORDER — HYDROMORPHONE HCL 1 MG/ML IJ SOLN
1.0000 mg | INTRAMUSCULAR | Status: DC | PRN
  Administered 2014-09-30 – 2014-10-01 (×5): 1 mg via INTRAVENOUS
  Filled 2014-09-30 (×5): qty 1

## 2014-09-30 MED ORDER — SODIUM CHLORIDE 0.9 % IV SOLN
500.0000 mg | Freq: Four times a day (QID) | INTRAVENOUS | Status: DC
  Filled 2014-09-30: qty 10

## 2014-09-30 MED ORDER — METOCLOPRAMIDE HCL 5 MG/ML IJ SOLN: 10.0000 mg | Freq: Four times a day (QID) | INTRAMUSCULAR | Status: DC

## 2014-09-30 MED ORDER — SODIUM CHLORIDE 0.9 % IV SOLN: INTRAVENOUS | Status: DC

## 2014-09-30 MED ORDER — OXYCODONE HCL 5 MG PO TABS
15.0000 mg | ORAL_TABLET | ORAL | Status: DC | PRN
  Administered 2014-09-30 – 2014-10-06 (×29): 15 mg via ORAL
  Filled 2014-09-30 (×29): qty 3

## 2014-09-30 MED ORDER — DICYCLOMINE HCL 20 MG PO TABS
20.0000 mg | ORAL_TABLET | Freq: Three times a day (TID) | ORAL | Status: DC
  Administered 2014-09-30 – 2014-10-06 (×24): 20 mg via ORAL
  Filled 2014-09-30 (×31): qty 1

## 2014-09-30 MED ORDER — SODIUM CHLORIDE 0.9 % IV SOLN
1000.0000 mL | INTRAVENOUS | Status: DC
  Administered 2014-09-30: 1000 mL via INTRAVENOUS

## 2014-09-30 MED ORDER — METOCLOPRAMIDE HCL 5 MG/ML IJ SOLN
10.0000 mg | Freq: Three times a day (TID) | INTRAMUSCULAR | Status: DC
  Administered 2014-09-30 – 2014-10-02 (×9): 10 mg via INTRAVENOUS
  Filled 2014-09-30 (×15): qty 2

## 2014-09-30 MED ORDER — SODIUM CHLORIDE 0.9 % IV SOLN
INTRAVENOUS | Status: DC
  Administered 2014-09-30 – 2014-10-01 (×2): via INTRAVENOUS

## 2014-09-30 MED ORDER — ERYTHROMYCIN LACTOBIONATE 500 MG IV SOLR
250.0000 mg | Freq: Four times a day (QID) | INTRAVENOUS | Status: DC
  Administered 2014-09-30 – 2014-10-01 (×3): 250 mg via INTRAVENOUS
  Filled 2014-09-30 (×6): qty 5

## 2014-09-30 MED ORDER — ONDANSETRON HCL 4 MG/2ML IJ SOLN
4.0000 mg | Freq: Three times a day (TID) | INTRAMUSCULAR | Status: DC | PRN
  Administered 2014-09-30: 4 mg via INTRAVENOUS
  Filled 2014-09-30: qty 2

## 2014-09-30 MED ORDER — ENOXAPARIN SODIUM 40 MG/0.4ML ~~LOC~~ SOLN
40.0000 mg | SUBCUTANEOUS | Status: DC
Start: 2014-09-30 — End: 2014-10-06
  Administered 2014-09-30 – 2014-10-05 (×6): 40 mg via SUBCUTANEOUS
  Filled 2014-09-30 (×7): qty 0.4

## 2014-09-30 MED ORDER — HYDROCORTISONE 2.5 % RE CREA
2.0000 "application " | TOPICAL_CREAM | Freq: Two times a day (BID) | RECTAL | Status: DC | PRN
  Filled 2014-09-30: qty 28.35

## 2014-09-30 MED ORDER — SUMATRIPTAN SUCCINATE 100 MG PO TABS
100.0000 mg | ORAL_TABLET | ORAL | Status: DC | PRN
  Administered 2014-10-01 – 2014-10-06 (×9): 100 mg via ORAL
  Filled 2014-09-30 (×15): qty 1

## 2014-09-30 MED ORDER — SODIUM CHLORIDE 0.9 % IV SOLN
1000.0000 mL | Freq: Once | INTRAVENOUS | Status: AC
  Administered 2014-09-30: 1000 mL via INTRAVENOUS

## 2014-09-30 MED ORDER — HYDROMORPHONE HCL 2 MG/ML IJ SOLN
2.0000 mg | Freq: Once | INTRAMUSCULAR | Status: AC
Start: 2014-09-30 — End: 2014-09-30
  Administered 2014-09-30: 2 mg via INTRAVENOUS
  Filled 2014-09-30: qty 1

## 2014-09-30 MED ORDER — METOCLOPRAMIDE HCL 5 MG/ML IJ SOLN
10.0000 mg | Freq: Once | INTRAMUSCULAR | Status: AC
  Administered 2014-09-30: 10 mg via INTRAVENOUS
  Filled 2014-09-30: qty 2

## 2014-09-30 MED ORDER — PANTOPRAZOLE SODIUM 40 MG IV SOLR
40.0000 mg | Freq: Every day | INTRAVENOUS | Status: DC
  Administered 2014-09-30: 40 mg via INTRAVENOUS
  Filled 2014-09-30 (×2): qty 40

## 2014-09-30 MED ORDER — HYDROMORPHONE HCL 1 MG/ML IJ SOLN
1.0000 mg | INTRAMUSCULAR | Status: DC | PRN
Start: 2014-09-30 — End: 2014-09-30

## 2014-09-30 MED ORDER — ZOLPIDEM TARTRATE 5 MG PO TABS
5.0000 mg | ORAL_TABLET | Freq: Once | ORAL | Status: AC
  Administered 2014-10-01: 5 mg via ORAL
  Filled 2014-09-30: qty 1

## 2014-09-30 MED ORDER — HYDROMORPHONE HCL 1 MG/ML IJ SOLN
1.0000 mg | Freq: Once | INTRAMUSCULAR | Status: DC
  Filled 2014-09-30: qty 1

## 2014-09-30 MED ORDER — ALPRAZOLAM 0.5 MG PO TABS
1.0000 mg | ORAL_TABLET | Freq: Two times a day (BID) | ORAL | Status: DC
  Administered 2014-09-30 – 2014-10-01 (×4): 1 mg via ORAL
  Administered 2014-10-02: 2 mg via ORAL
  Administered 2014-10-02: 1 mg via ORAL
  Administered 2014-10-03 – 2014-10-06 (×7): 2 mg via ORAL
  Filled 2014-09-30 (×2): qty 4
  Filled 2014-09-30: qty 2
  Filled 2014-09-30 (×3): qty 4
  Filled 2014-09-30 (×2): qty 2
  Filled 2014-09-30: qty 4
  Filled 2014-09-30: qty 2
  Filled 2014-09-30 (×3): qty 4

## 2014-09-30 MED ORDER — HYDROMORPHONE HCL 2 MG/ML IJ SOLN
2.0000 mg | INTRAMUSCULAR | Status: DC | PRN
  Administered 2014-09-30 (×2): 2 mg via INTRAVENOUS
  Filled 2014-09-30 (×2): qty 1

## 2014-09-30 MED ORDER — MONTELUKAST SODIUM 10 MG PO TABS
10.0000 mg | ORAL_TABLET | Freq: Every day | ORAL | Status: DC
  Administered 2014-09-30 – 2014-10-05 (×6): 10 mg via ORAL
  Filled 2014-09-30 (×7): qty 1

## 2014-09-30 MED ORDER — ONDANSETRON HCL 4 MG/2ML IJ SOLN
4.0000 mg | Freq: Once | INTRAMUSCULAR | Status: AC
  Administered 2014-09-30: 4 mg via INTRAVENOUS
  Filled 2014-09-30: qty 2

## 2014-09-30 NOTE — ED Notes (Signed)
Pt reports to ed with abd.cramping, vomiting, blisters on feet. Sts last time he ate something was on Tuesday. Sts was diagnosed with IBS, gastroparesis and H pillory.

## 2014-09-30 NOTE — ED Provider Notes (Signed)
CSN: [id number]     Arrival date & time 09/30/14  [id number] History   First MD Initiated Contact with Patient 09/30/14 1116     Chief Complaint  Patient presents with  . Nausea  . Emesis  . blisters on feet   . Abdominal Cramping      HPI Patient reports known history of gastroparesis.  Reports developing nausea vomiting and abdominal cramping over the past 48 hours.  Reports this feels similar to his gastroparesis before the past.  His been unable to keep fluids down.  He feels weak and dehydrated.  He denies fevers and chills.  No urinary complaints.  Reports moderate abdominal cramping pain at this time.   Past Medical History  Diagnosis Date  . Anxiety   . Allergy   . Arthritis   . Migraine headache   . Tourettes disease    Past Surgical History  Procedure Laterality Date  . Knee arthroscopy Left 11/2009  . Colonoscopy N/A 08/11/2014    Procedure: COLONOSCOPY;  Surgeon: Meryl DareMalcolm T Stark, MD;  Location: [facility];  Service: Endoscopy;  Laterality: N/A;   Family History  Problem Relation Age of Onset  . Diabetes Father   . Diabetes Paternal Grandfather   . Diabetes Maternal Grandfather   . Colon cancer Neg Hx   . Esophageal cancer Neg Hx   . Stomach cancer Neg Hx   . Rectal cancer Neg Hx    History  Substance Use Topics  . Smoking status: Former Smoker    Types: Cigarettes    Quit date: 04/10/2009  . Smokeless tobacco: Never Used  . Alcohol Use: No    Review of Systems  All other systems reviewed and are negative.     Allergies  Penicillins  Home Medications   Prior to Admission medications   Medication Sig Start Date End Date Taking? Authorizing Provider  alprazolam Prudy Feeler(XANAX) 2 MG tablet Take 1-2 mg by mouth 2 (two) times daily.    Yes Historical Provider, MD  dicyclomine (BENTYL) 20 MG tablet Take 1 tablet (20 mg total) by mouth 4 (four) times daily -  before meals and at bedtime. 08/13/14  Yes Lori P Hvozdovic, PA-C  hydrocortisone (ANUSOL-HC) 2.5 %  rectal cream Place 2 application rectally 2 (two) times daily as needed (hemorrhoids). Apply rectally 2 times daily prn 07/21/14  Yes Suzi RootsKevin E Steinl, MD  metoCLOPramide (REGLAN) 10 MG tablet Take 1 tablet (10 mg total) by mouth 4 (four) times daily -  before meals and at bedtime. 09/01/14  Yes Beverley FiedlerJay M Pyrtle, MD  montelukast (SINGULAIR) 10 MG tablet Take 10 mg by mouth at bedtime.   Yes Historical Provider, MD  omeprazole (PRILOSEC) 20 MG capsule Take 1 capsule (20 mg total) by mouth daily. 08/29/14  Yes Amy S Esterwood, PA-C  oxyCODONE (ROXICODONE) 15 MG immediate release tablet Take 1 tablet (15 mg total) by mouth every 4 (four) hours as needed for pain. 08/13/14  Yes Lori P Hvozdovic, PA-C  rizatriptan (MAXALT) 10 MG tablet Take 10 mg by mouth as needed for migraine (migraine). May repeat in 2 hours if needed   Yes Historical Provider, MD  clarithromycin (BIAXIN) 500 MG tablet Take 1 tablet (500 mg total) by mouth 2 (two) times daily. Patient not taking: Reported on 09/30/2014 09/18/14   Beverley FiedlerJay M Pyrtle, MD  cyclobenzaprine (FLEXERIL) 5 MG tablet take 1 tablet by mouth three times a day if needed Patient not taking: Reported on 09/30/2014 09/04/14   Amy Oswald HillockS Esterwood,  PA-C  metroNIDAZOLE (FLAGYL) 250 MG tablet Take 1 tablet (250 mg total) by mouth 4 (four) times daily. Patient not taking: Reported on 09/30/2014 09/18/14   Beverley FiedlerJay M Pyrtle, MD   BP 118/69 mmHg  Pulse 62  Temp(Src) 98.1 F (36.7 C) (Oral)  Resp 18  SpO2 98% Physical Exam  Constitutional: He is oriented to person, place, and time. He appears well-developed and well-nourished.  HENT:  Head: Normocephalic and atraumatic.  Eyes: EOM are normal.  Neck: Normal range of motion.  Cardiovascular: Normal rate, regular rhythm, normal heart sounds and intact distal pulses.   Pulmonary/Chest: Effort normal and breath sounds normal. No respiratory distress.  Abdominal: Soft. He exhibits no distension.  Generalized abdominal tenderness without  guarding or rebound  Musculoskeletal: Normal range of motion.  Neurological: He is alert and oriented to person, place, and time.  Skin: Skin is warm and dry.  Psychiatric: He has a normal mood and affect. Judgment normal.  Nursing note and vitals reviewed.   ED Course  Procedures (including critical care time) Labs Review Labs Reviewed  CBC WITH DIFFERENTIAL - Abnormal; Notable for the following:    Neutrophils Relative % 80 (*)    All other components within normal limits  COMPREHENSIVE METABOLIC PANEL - Abnormal; Notable for the following:    Glucose, Bld 106 (*)    All other components within normal limits  LIPASE, BLOOD    Imaging Review No results found.   EKG Interpretation None      MDM   Final diagnoses:  Gastroparesis    2:06 PM Patient is beginning to feel bad at this time and is reluctant to go home.  He feels as though he would benefit from IV fluids and ongoing management of his symptoms overnight.  Consultation will be made for admission to the hospital    Lyanne CoKevin M Kimila Papaleo, MD 09/30/14 1407

## 2014-09-30 NOTE — H&P (Signed)
[facility] History and Physical  Shane MorinRoss M Albanese WUJ:[id number] DOB: [birth year] DOA: 09/30/2014  Referring physician: EDP PCP: Tally DueGUEST, [name], MD   Chief Complaint: nausea vomiting and abdominal pain since 2 days   HPI: Shane Curry is a 38 y.o. male with h/o gastroparesis, comes in for nausea, vomiting and abdominal pain since 2 days. He reports tha symptoms have not improved despite reglan and nausea medications. On arrival to ED, he was in severe abdominal pain, generalized and associated with bloating and nausea. He denies diarrhea, or fever or chills. His labs are unremarkable he is referred to medical service for admission.    Review of Systems:  Constitutional:  No weight loss, night sweats, Fevers, chills, fatigue.  HEENT:  No headaches, Difficulty swallowing,Tooth/dental problems,Sore throat,  No sneezing, itching, ear ache, nasal congestion, post nasal drip,  Cardio-vascular:  No chest pain, Orthopnea, PND, swelling in lower extremities, anasarca, dizziness, palpitations  GI:  Nausea, vomiting and abdominal pain.  Resp:  No shortness of breath with exertion or at rest. No excess mucus, no productive cough, No non-productive cough, No coughing up of blood.No change in color of mucus.No wheezing.No chest wall deformity  Skin:  no rash or lesions.  GU:  no dysuria, change in color of urine, no urgency or frequency. No flank pain.  Musculoskeletal:  No joint pain or swelling. No decreased range of motion. No back pain.  Psych:  No change in mood or affect. No depression or anxiety. No memory loss.   Past Medical History  Diagnosis Date  . Anxiety   . Allergy   . Arthritis   . Migraine headache   . Tourettes disease    Past Surgical History  Procedure Laterality Date  . Knee arthroscopy Left 11/2009  . Colonoscopy N/A 08/11/2014    Procedure: COLONOSCOPY;  Surgeon: Meryl DareMalcolm T Stark, MD;  Location: [facility];  Service: Endoscopy;  Laterality: N/A;     Social History:  reports that he quit smoking about 5 years ago. His smoking use included Cigarettes. He smoked 0.00 packs per day. He has never used smokeless tobacco. He reports that he does not drink alcohol or use illicit drugs.  Allergies  Allergen Reactions  . Penicillins     Childhood reaction     Family History  Problem Relation Age of Onset  . Diabetes Father   . Diabetes Paternal Grandfather   . Diabetes Maternal Grandfather   . Colon cancer Neg Hx   . Esophageal cancer Neg Hx   . Stomach cancer Neg Hx   . Rectal cancer Neg Hx      Prior to Admission medications   Medication Sig Start Date End Date Taking? Authorizing Provider  alprazolam Prudy Feeler(XANAX) 2 MG tablet Take 1-2 mg by mouth 2 (two) times daily.    Yes Historical Provider, MD  dicyclomine (BENTYL) 20 MG tablet Take 1 tablet (20 mg total) by mouth 4 (four) times daily -  before meals and at bedtime. 08/13/14  Yes Lori P Hvozdovic, PA-C  hydrocortisone (ANUSOL-HC) 2.5 % rectal cream Place 2 application rectally 2 (two) times daily as needed (hemorrhoids). Apply rectally 2 times daily prn 07/21/14  Yes Suzi RootsKevin E Steinl, MD  metoCLOPramide (REGLAN) 10 MG tablet Take 1 tablet (10 mg total) by mouth 4 (four) times daily -  before meals and at bedtime. 09/01/14  Yes Beverley FiedlerJay M Pyrtle, MD  montelukast (SINGULAIR) 10 MG tablet Take 10 mg by mouth at bedtime.   Yes Historical Provider,  MD  omeprazole (PRILOSEC) 20 MG capsule Take 1 capsule (20 mg total) by mouth daily. 08/29/14  Yes Amy S Esterwood, PA-C  oxyCODONE (ROXICODONE) 15 MG immediate release tablet Take 1 tablet (15 mg total) by mouth every 4 (four) hours as needed for pain. 08/13/14  Yes Lori P Hvozdovic, PA-C  rizatriptan (MAXALT) 10 MG tablet Take 10 mg by mouth as needed for migraine (migraine). May repeat in 2 hours if needed   Yes Historical Provider, MD  clarithromycin (BIAXIN) 500 MG tablet Take 1 tablet (500 mg total) by mouth 2 (two) times daily. Patient not taking:  Reported on 09/30/2014 09/18/14   Beverley FiedlerJay M Pyrtle, MD  cyclobenzaprine (FLEXERIL) 5 MG tablet take 1 tablet by mouth three times a day if needed Patient not taking: Reported on 09/30/2014 09/04/14   Sammuel CooperAmy S Esterwood, PA-C   Physical Exam: Filed Vitals:   09/30/14 1016 09/30/14 1306 09/30/14 1406  BP: 114/83 118/69 114/72  Pulse: 72 62 91  Temp: 98.1 F (36.7 C)    TempSrc: Oral    Resp: 18    SpO2: 97% 98% 100%    Wt Readings from Last 3 Encounters:  09/01/14 57.153 kg (126 lb)  08/29/14 57.323 kg (126 lb 6 oz)  08/11/14 59.478 kg (131 lb 2 oz)    General:  Appears calm and comfortable Eyes: PERRL, normal lids, irises & conjunctiva Neck: no LAD, masses or thyromegaly Cardiovascular: RRR, no m/r/g. No LE edema. Telemetry: SR, no arrhythmias  Respiratory: CTA bilaterally, no w/r/r. Normal respiratory effort. Abdomen: soft, ntnd Skin: rash on the toes bilateral, erythematous , non tender.  Musculoskeletal: grossly normal tone BUE/BLE Psychiatric: grossly normal mood and affect, speech fluent and appropriate Neurologic: grossly non-focal.          Labs on Admission:  Basic Metabolic Panel:  Recent Labs Lab 09/30/14 1157  NA 140  K 3.8  CL 100  CO2 26  GLUCOSE 106*  BUN 16  CREATININE 0.79  CALCIUM 10.0   Liver Function Tests:  Recent Labs Lab 09/30/14 1157  AST 16  ALT 20  ALKPHOS 54  BILITOT 1.0  PROT 7.8  ALBUMIN 4.5    Recent Labs Lab 09/30/14 1157  LIPASE 24   No results for input(s): AMMONIA in the last 168 hours. CBC:  Recent Labs Lab 09/30/14 1157  WBC 6.9  NEUTROABS 5.4  HGB 14.1  HCT 42.1  MCV 85.7  PLT 175   Cardiac Enzymes: No results for input(s): CKTOTAL, CKMB, CKMBINDEX, TROPONINI in the last 168 hours.  BNP (last 3 results) No results for input(s): PROBNP in the last 8760 hours. CBG: No results for input(s): GLUCAP in the last 168 hours.  Radiological Exams on Admission: No results found.    Assessment/Plan Active  Problems:   Gastroparesis   Nausea, vomiting and abdominal pain Probably secondary to worsening of gastroparesis as he reports his symptoms are similar to when he has gastroparesis vs gastroenteritis.  Hydration, anti emetics and IV erythromycin.  Clear liquid diet and advance as tolerated.   Code Status: full code DVT Prophylaxis: Family Communication: none at bedside Disposition Plan: admit to med surg.  Time spent: 55 min  [facility] LLCKULA,Ceri Mayer [facility] Pager [phone]

## 2014-09-30 NOTE — ED Notes (Signed)
Bed: [id number] Expected date:  Expected time:  Means of arrival:  [facility]

## 2014-09-30 NOTE — ED Notes (Signed)
Called to give report, Diplomatic Services operational officersecretary reported she has not talked to charge RN about which room pt is assigned. Will return phone call.

## 2014-09-30 NOTE — Progress Notes (Signed)
Received report from ED RN, Pt arrived unit on stretcher, alert and oriented, able to verbalize needs. MD notified of Pt's location, will continue with current plan of care.

## 2014-10-01 DIAGNOSIS — Z87891 Personal history of nicotine dependence: Secondary | ICD-10-CM | POA: Diagnosis not present

## 2014-10-01 DIAGNOSIS — Z9119 Patient's noncompliance with other medical treatment and regimen: Secondary | ICD-10-CM | POA: Diagnosis present

## 2014-10-01 DIAGNOSIS — M1990 Unspecified osteoarthritis, unspecified site: Secondary | ICD-10-CM | POA: Diagnosis present

## 2014-10-01 DIAGNOSIS — E43 Unspecified severe protein-calorie malnutrition: Secondary | ICD-10-CM | POA: Diagnosis present

## 2014-10-01 DIAGNOSIS — F419 Anxiety disorder, unspecified: Secondary | ICD-10-CM

## 2014-10-01 DIAGNOSIS — K529 Noninfective gastroenteritis and colitis, unspecified: Secondary | ICD-10-CM | POA: Diagnosis present

## 2014-10-01 DIAGNOSIS — F952 Tourette's disorder: Secondary | ICD-10-CM | POA: Diagnosis present

## 2014-10-01 DIAGNOSIS — G4700 Insomnia, unspecified: Secondary | ICD-10-CM | POA: Diagnosis present

## 2014-10-01 DIAGNOSIS — B9681 Helicobacter pylori [H. pylori] as the cause of diseases classified elsewhere: Secondary | ICD-10-CM | POA: Diagnosis present

## 2014-10-01 DIAGNOSIS — A048 Other specified bacterial intestinal infections: Secondary | ICD-10-CM

## 2014-10-01 DIAGNOSIS — R112 Nausea with vomiting, unspecified: Secondary | ICD-10-CM | POA: Diagnosis present

## 2014-10-01 DIAGNOSIS — G894 Chronic pain syndrome: Secondary | ICD-10-CM

## 2014-10-01 DIAGNOSIS — R1013 Epigastric pain: Secondary | ICD-10-CM

## 2014-10-01 DIAGNOSIS — K3184 Gastroparesis: Secondary | ICD-10-CM | POA: Diagnosis present

## 2014-10-01 DIAGNOSIS — Z79899 Other long term (current) drug therapy: Secondary | ICD-10-CM | POA: Diagnosis not present

## 2014-10-01 DIAGNOSIS — Z596 Low income: Secondary | ICD-10-CM | POA: Diagnosis not present

## 2014-10-01 DIAGNOSIS — R1110 Vomiting, unspecified: Secondary | ICD-10-CM

## 2014-10-01 MED ORDER — METRONIDAZOLE 250 MG PO TABS
250.0000 mg | ORAL_TABLET | Freq: Four times a day (QID) | ORAL | Status: DC
  Administered 2014-10-01 – 2014-10-02 (×5): 250 mg via ORAL
  Filled 2014-10-01 (×10): qty 1

## 2014-10-01 MED ORDER — PANTOPRAZOLE SODIUM 40 MG IV SOLR
40.0000 mg | Freq: Two times a day (BID) | INTRAVENOUS | Status: DC
  Administered 2014-10-01 – 2014-10-02 (×3): 40 mg via INTRAVENOUS
  Filled 2014-10-01 (×4): qty 40

## 2014-10-01 MED ORDER — ONDANSETRON HCL 4 MG/2ML IJ SOLN
4.0000 mg | Freq: Four times a day (QID) | INTRAMUSCULAR | Status: DC
  Administered 2014-10-01 – 2014-10-06 (×20): 4 mg via INTRAVENOUS
  Filled 2014-10-01 (×21): qty 2

## 2014-10-01 MED ORDER — CLARITHROMYCIN 500 MG PO TABS
500.0000 mg | ORAL_TABLET | Freq: Two times a day (BID) | ORAL | Status: DC
  Administered 2014-10-01 – 2014-10-02 (×2): 500 mg via ORAL
  Filled 2014-10-01 (×3): qty 1

## 2014-10-01 MED ORDER — HYDROMORPHONE HCL 1 MG/ML IJ SOLN
1.0000 mg | Freq: Once | INTRAMUSCULAR | Status: AC
  Administered 2014-10-01: 1 mg via INTRAVENOUS
  Filled 2014-10-01: qty 1

## 2014-10-01 MED ORDER — HYDROMORPHONE HCL 1 MG/ML IJ SOLN
0.5000 mg | INTRAMUSCULAR | Status: DC | PRN
  Administered 2014-10-01 – 2014-10-02 (×5): 0.5 mg via INTRAVENOUS
  Filled 2014-10-01 (×6): qty 1

## 2014-10-01 MED ORDER — ZOLPIDEM TARTRATE 5 MG PO TABS
5.0000 mg | ORAL_TABLET | Freq: Every evening | ORAL | Status: DC | PRN
  Administered 2014-10-01 – 2014-10-04 (×3): 5 mg via ORAL
  Filled 2014-10-01 (×3): qty 1

## 2014-10-01 NOTE — Progress Notes (Signed)
UR completed 

## 2014-10-01 NOTE — Progress Notes (Signed)
PROGRESS NOTE  Shane Curry YQI:[id number] DOB: [birth year] DOA: 09/30/2014 PCP: Tally DueGUEST, [name], MD  GI: Dr. Rhea BeltonPyrtle  Summary: 38 year old man with history of gastroparesis of unclear etiology presented with nausea, vomiting and abdominal pain for 48 hours. Symptoms and failed to improve in the emergency department and he was admitted for further evaluation and treatment; symptoms were presumed to be secondary to gastroparesis. No fever, diarrhea.  Assessment/Plan: 1. Nausea, vomiting, abdominal pain. Suspect flare of gastroparesis, consider untreated H. pylori. He has been noncompliant with scheduled Reglan, taking only PRN. He has had extensive workup. No further testing at this point, plan and GI consultation tomorrow. 2. Gastroparesis of unclear etiology. Continue Reglan scheduled. Schedule antibiotics. Recommend small frequent meals. 3. H. pylori. Noncompliant with treatment recommended secondary to finances. (Omeprazole 40mg  BID, metronidazole 250 QID, clarithromycin 500mg  BID x14 days). 4. Diagnosed with ascending colitis by CT of the abdomen and pelvis. Admitted by GI 10/8, treated with empiric antibiotics, S/p unremarkable colonosopy 08/11/2014.TSH normal, celiac testing unremarkable, biopsies negative for microscopic colitis. Per GI note 10/27 some concern for psychogenic symptoms secondary to severe anxiety, also narcotic bowel syndrome, consider migraine. Started on Prilosec, Flexeril. Outpatient EGD 10/30 revealed significant retained food in the stomach consistent with gastroparesis, and patient was started on Reglan 3 times a day and daily at bedtime. Referred for neurology evalution to assess for migraine complex with abdominal pain. Also referred for outpatient psych consultation. 5. Anxiety, Tourette's syndrome, insomnia. 6. Chronic pain syndrome maintained on narcotics for knee pain, followed by pain clinic   He appears stable but continues to have significant pain and  difficulty tolerating liquids. Plan hydration, anti-emetics, scheduled Reglan. He has been noncompliant with both Reglan and H. pylori treatment.  Start recommended H. pylori treatment.  Plan GI consultation in the morning for further recommendations  Minimize narcotics. I discussed the rationale with the patient and he agreed.  Code Status: full DVT prophylaxis: Lovenox Family Communication: none present Disposition Plan: home   Brendia Sacksaniel Goodrich, MD  [facility]  Pager [phone] If 7PM-7AM, please contact night-coverage at [email or web address], password [email or web address] 10/01/2014, 7:45 AM  LOS: 1 day   Consultants:    Procedures:    Antibiotics:    HPI/Subjective: Complains of abdominal pain, predominantly upper/epigastric in nature. Slight better last night in usual. He is tolerating liquids. Vomiting has improved. He feels miserable.  Objective: Filed Vitals:   09/30/14 1545 09/30/14 2149 10/01/14 0509 10/01/14 0514  BP: 121/79 114/70 92/54 119/77  Pulse: 65 49 68 48  Temp: 98 F (36.7 C) 98.3 F (36.8 C) 97.8 F (36.6 C) 97.5 F (36.4 C)  TempSrc: Oral Oral Oral Oral  Resp: 18 18 18 18   Height: 5\' 9"  (1.753 m)     SpO2: 100% 98% 99% 100%    Intake/Output Summary (Last 24 hours) at 10/01/14 0745 Last data filed at 10/01/14 0700  Gross per 24 hour  Intake 1992.08 ml  Output      0 ml  Net 1992.08 ml     There were no vitals filed for this visit.  Exam:     Afebrile, vital signs are stable. No hypoxia.   General: Appears calm, mildly uncomfortable. Nontoxic.  Psych: Alert. Speech fluent and clear.  CV: Regular rate and rhythm. No murmur, rub or gallop.  Respiratory: Clear to auscultation bilaterally. No wheezes, rales or rhonchi. Normal respiratory effort.  Abdomen: Thin, soft, mild generalized tenderness. No distention.  Skin: Appears grossly unremarkable except for maculopapular rash  over that dorsal aspect of toes both feet,  nontender.  Musculoskeletal: Appears grossly unremarkable  Neuro: Appears grossly unremarkable  Data Reviewed:  Complete metabolic panel unremarkable.   CBC was within normal limits  Abdominal films were unremarkable.  Scheduled Meds: . sodium chloride   Intravenous STAT  . alprazolam  1-2 mg Oral BID  . dicyclomine  20 mg Oral TID AC & HS  . enoxaparin (LOVENOX) injection  40 mg Subcutaneous Q24H  . erythromycin  250 mg Intravenous 4 times per day  . metoCLOPramide (REGLAN) injection  10 mg Intravenous TID AC & HS  . montelukast  10 mg Oral QHS  . pantoprazole (PROTONIX) IV  40 mg Intravenous Daily   Continuous Infusions: . sodium chloride Stopped (09/30/14 2008)  . sodium chloride 75 mL/hr at 10/01/14 0016    Principal Problem:   Nausea and vomiting Active Problems:   Gastroparesis   Abdominal pain, epigastric   H. pylori infection   Anxiety   Chronic pain syndrome   Time spent 20 minutes

## 2014-10-02 DIAGNOSIS — F419 Anxiety disorder, unspecified: Secondary | ICD-10-CM

## 2014-10-02 LAB — HIV ANTIBODY (ROUTINE TESTING W REFLEX): HIV: NONREACTIVE

## 2014-10-02 LAB — BASIC METABOLIC PANEL
ANION GAP: 11 (ref 5–15)
BUN: 7 mg/dL (ref 6–23)
CALCIUM: 8.9 mg/dL (ref 8.4–10.5)
CO2: 28 meq/L (ref 19–32)
Chloride: 103 mEq/L (ref 96–112)
Creatinine, Ser: 0.94 mg/dL (ref 0.50–1.35)
GFR calc non Af Amer: >90 mL/min (ref >90)
Glucose, Bld: 106 mg/dL — ABNORMAL HIGH (ref 70–99)
Potassium: 3.3 mEq/L — ABNORMAL LOW (ref 3.7–5.3)
SODIUM: 142 meq/L (ref 137–147)

## 2014-10-02 MED ORDER — PANTOPRAZOLE SODIUM 40 MG PO TBEC
40.0000 mg | DELAYED_RELEASE_TABLET | Freq: Two times a day (BID) | ORAL | Status: DC
  Administered 2014-10-02 – 2014-10-06 (×8): 40 mg via ORAL
  Filled 2014-10-02 (×11): qty 1

## 2014-10-02 MED ORDER — POTASSIUM CHLORIDE CRYS ER 20 MEQ PO TBCR
40.0000 meq | EXTENDED_RELEASE_TABLET | Freq: Two times a day (BID) | ORAL | Status: AC
  Administered 2014-10-02 (×2): 40 meq via ORAL
  Filled 2014-10-02 (×3): qty 2

## 2014-10-02 MED ORDER — METOCLOPRAMIDE HCL 10 MG PO TABS
10.0000 mg | ORAL_TABLET | Freq: Three times a day (TID) | ORAL | Status: DC
  Administered 2014-10-02 – 2014-10-06 (×15): 10 mg via ORAL
  Filled 2014-10-02 (×19): qty 1

## 2014-10-02 NOTE — Progress Notes (Signed)
PROGRESS NOTE  Shane Curry [id number] DOB: 09-22-1976 DOA: 09/30/2014 PCP: Shane DueGUEST, [name], MD  GI: Dr. Rhea Curry  Summary: 38 year old man with history of gastroparesis of unclear etiology presented with nausea, vomiting and abdominal pain for 48 hours. Symptoms and failed to improve in the emergency department and he was admitted for further evaluation and treatment; symptoms were presumed to be secondary to gastroparesis. No fever, diarrhea.  Assessment/Plan: 1. Nausea, vomiting, abdominal pain. Suspect flare of gastroparesis, consider untreated H. pylori. He has been noncompliant with scheduled Reglan, taking only PRN. Overall some improvement. Recommendations per GI. 2. Gastroparesis of unclear etiology. Continue Reglan scheduled. Management per GI. 3. H. pylori. Noncompliant with treatment recommended secondary to finances. GI has recommended holding off on treatment until his acute vomiting abdominal pain resolves. 4. Diagnosed with ascending colitis by CT of the abdomen and pelvis. Admitted by GI 10/8, treated with empiric antibiotics, S/p unremarkable colonosopy 08/11/2014.TSH normal, celiac testing unremarkable, biopsies negative for microscopic colitis. Per GI note 10/27 some concern for psychogenic symptoms secondary to severe anxiety, also narcotic bowel syndrome, consider migraine. Started on Prilosec, Flexeril. Outpatient EGD 10/30 revealed significant retained food in the stomach consistent with gastroparesis, and patient was started on Reglan 3 times a day and daily at bedtime. Referred for neurology evalution to assess for migraine complex with abdominal pain. Also referred for outpatient psych consultation. 5. Anxiety, Tourette's syndrome, insomnia. Appears stable. 6. Chronic pain syndrome maintained on narcotics for knee pain, followed by pain clinic.   Overall he has made some improvement today. Plan to continue Reglan, anti-emetics. Continue liquids. He hopes to go  home tomorrow.  Stop IV narcotics at this time. Continue chronic narcotics for pain. Support his desire to wean off these in the outpatient setting.  He does endorse stress but this began after his abdominal complaints resulting in the loss of his job and strained finances. He does not appear overly depressed at this point. Outpatient follow-up could be pursued as desired.  Code Status: full DVT prophylaxis: Lovenox Family Communication: none present Disposition Plan: home   Shane Sacksaniel Omair Dettmer, MD  [facility]  Pager [phone] If 7PM-7AM, please contact night-coverage at [email or web address], password [email or web address] 10/02/2014, 6:14 PM  LOS: 2 days   Consultants:    Procedures:    Antibiotics:    HPI/Subjective: Overall some improvement today. He has been able to tolerate some liquids. He continues to have abdominal pain but he understands the rationale for limiting narcotics. He also voices the desire to wean off narcotics as an outpatient if possible. Continues to have some abdominal pain. He was evaluated by GI with recommendations for ongoing hydration, antibiotics and Reglan. Treatment for H. pylori was discontinued at this point and can be reinitiated by them in the outpatient setting.  Patient does endorse stress.   Objective: Filed Vitals:   10/01/14 1513 10/01/14 2123 10/02/14 0552 10/02/14 1533  BP: 113/67 115/78 108/67 130/80  Pulse: 77 47 45 46  Temp: 98.2 F (36.8 C) 98.2 F (36.8 C) 98.2 F (36.8 C) 97.5 F (36.4 C)  TempSrc: Oral Oral Oral Axillary  Resp: 18 18 18 18   Height:      SpO2: 99% 98% 98% 98%    Intake/Output Summary (Last 24 hours) at 10/02/14 1814 Last data filed at 10/02/14 1200  Gross per 24 hour  Intake    240 ml  Output      0 ml  Net    240 ml     There  were no vitals filed for this visit.  Exam:     Afebrile, VSS, no hypoxia.  Appears calm, comfortable. Overall appears a bit better today.  Rest right clear to auscultation  bilaterally. No wheezes, rales or rhonchi. Normal respiratory effort.  Cardiovascular regular rate and rhythm. No murmur, rub or gallop.   Abdomen soft, less tender today. Benign examination.  Data Reviewed:  Potassium 3.3. Basic metabolic panel otherwise unremarkable.  Scheduled Meds: . alprazolam  1-2 mg Oral BID  . dicyclomine  20 mg Oral TID AC & HS  . enoxaparin (LOVENOX) injection  40 mg Subcutaneous Q24H  . metoCLOPramide (REGLAN) injection  10 mg Intravenous TID AC & HS  . montelukast  10 mg Oral QHS  . ondansetron (ZOFRAN) IV  4 mg Intravenous 4 times per day  . pantoprazole (PROTONIX) IV  40 mg Intravenous Q12H  . potassium chloride  40 mEq Oral BID   Continuous Infusions:    Principal Problem:   Nausea and vomiting Active Problems:   Gastroparesis   Abdominal pain, epigastric   H. pylori infection   Anxiety   Chronic pain syndrome   Time spent 15 minutes

## 2014-10-02 NOTE — Consult Note (Signed)
Referring Provider: Dr. Irene LimboGoodrich Primary Care Physician:  Tally DueGUEST, [name], MD Primary Gastroenterologist:  Dr. Rhea BeltonPyrtle  Reason for Consultation:  Abdominal pain, nausea/vomiting  HPI: Shane Curry is a 38 y.o. male with PMH of anxiety, Tourette's syndrome, insomnia, and chronic pain syndrome maintained on narcotics for knee pain at pain clinic.  He has recent diagnosis of gastroparesis.  Comes to the ED on 11/28 for nausea, vomiting, and abdominal pain of 2 days duration.  he says that overall he has been experiencing these GI issues on and off for about 2 years.  Symptoms are exactly the same as previously.    Colonoscopy 08/2014 by Dr. Russella DarStark was normal with benign/normal random biopsies.    EGD 08/2014 by Dr. Rhea BeltonPyrtle showed significant retained food material in the stomach c/w gastroparesis.  Reglan 10 mg ACHS was started at that time but he has been non-compliant with that (says that he couldn't afford it).  Also had Hpylori gastritis by biopsies and was started on therapy, however, due to cost he was non-compliant with that as well.  Celiac testing negative.  He is on clear liquids currently but only tolerating sips.  Receiving Reglan 10 mg IV ACHS, Zofran 4 mg IV four times per day, PPI BID, and dicyclomine 20 mg four times daily.  Has also been started on flagyl and biaxin for the Hpylori.  Still complaining of a lot of nausea and mid/epigastric abdominal pain.  Says that the pain medications only last 30-60 minutes and that he has a very high tolerance for these pain medications.   Past Medical History  Diagnosis Date  . Anxiety   . Allergy   . Arthritis   . Migraine headache   . Tourettes disease     Past Surgical History  Procedure Laterality Date  . Knee arthroscopy Left 11/2009  . Colonoscopy N/A 08/11/2014    Procedure: COLONOSCOPY;  Surgeon: Meryl DareMalcolm T Stark, MD;  Location: [facility];  Service: Endoscopy;  Laterality: N/A;    Prior to Admission medications     Medication Sig Start Date End Date Taking? Authorizing Provider  alprazolam Prudy Feeler(XANAX) 2 MG tablet Take 1-2 mg by mouth 2 (two) times daily.    Yes Historical Provider, MD  dicyclomine (BENTYL) 20 MG tablet Take 1 tablet (20 mg total) by mouth 4 (four) times daily -  before meals and at bedtime. 08/13/14  Yes Lori P Hvozdovic, PA-C  hydrocortisone (ANUSOL-HC) 2.5 % rectal cream Place 2 application rectally 2 (two) times daily as needed (hemorrhoids). Apply rectally 2 times daily prn 07/21/14  Yes Suzi RootsKevin E Steinl, MD  metoCLOPramide (REGLAN) 10 MG tablet Take 1 tablet (10 mg total) by mouth 4 (four) times daily -  before meals and at bedtime. 09/01/14  Yes Beverley FiedlerJay M Pyrtle, MD  montelukast (SINGULAIR) 10 MG tablet Take 10 mg by mouth at bedtime.   Yes Historical Provider, MD  omeprazole (PRILOSEC) 20 MG capsule Take 1 capsule (20 mg total) by mouth daily. 08/29/14  Yes Amy S Esterwood, PA-C  oxyCODONE (ROXICODONE) 15 MG immediate release tablet Take 1 tablet (15 mg total) by mouth every 4 (four) hours as needed for pain. 08/13/14  Yes Lori P Hvozdovic, PA-C  rizatriptan (MAXALT) 10 MG tablet Take 10 mg by mouth as needed for migraine (migraine). May repeat in 2 hours if needed   Yes Historical Provider, MD  clarithromycin (BIAXIN) 500 MG tablet Take 1 tablet (500 mg total) by mouth 2 (two) times daily. Patient not  taking: Reported on 09/30/2014 09/18/14   Beverley Fiedler, MD  cyclobenzaprine (FLEXERIL) 5 MG tablet take 1 tablet by mouth three times a day if needed Patient not taking: Reported on 09/30/2014 09/04/14   Amy S Esterwood, PA-C    Current Facility-Administered Medications  Medication Dose Route Frequency Provider Last Rate Last Dose  . ALPRAZolam Prudy Feeler) tablet 1-2 mg  1-2 mg Oral BID Kathlen Mody, MD   1 mg at 10/01/14 2319  . clarithromycin (BIAXIN) tablet 500 mg  500 mg Oral Q12H Standley Brooking, MD   500 mg at 10/01/14 2318  . dicyclomine (BENTYL) tablet 20 mg  20 mg Oral TID AC & HS Kathlen Mody, MD   20 mg at 10/01/14 2318  . enoxaparin (LOVENOX) injection 40 mg  40 mg Subcutaneous Q24H Kathlen Mody, MD   40 mg at 10/01/14 1727  . hydrocortisone (ANUSOL-HC) 2.5 % rectal cream 2 application  2 application Rectal BID PRN Kathlen Mody, MD      . HYDROmorphone (DILAUDID) injection 0.5 mg  0.5 mg Intravenous Q4H PRN Standley Brooking, MD   0.5 mg at 10/02/14 0716  . metoCLOPramide (REGLAN) injection 10 mg  10 mg Intravenous TID AC & HS Otho Bellows, RPH   10 mg at 10/01/14 2317  . metroNIDAZOLE (FLAGYL) tablet 250 mg  250 mg Oral QID Standley Brooking, MD   250 mg at 10/01/14 2319  . montelukast (SINGULAIR) tablet 10 mg  10 mg Oral QHS Kathlen Mody, MD   10 mg at 10/01/14 2320  . ondansetron (ZOFRAN) injection 4 mg  4 mg Intravenous 4 times per day Standley Brooking, MD   4 mg at 10/02/14 0716  . oxyCODONE (Oxy IR/ROXICODONE) immediate release tablet 15 mg  15 mg Oral Q4H PRN Kathlen Mody, MD   15 mg at 10/02/14 0425  . pantoprazole (PROTONIX) injection 40 mg  40 mg Intravenous Q12H Standley Brooking, MD   40 mg at 10/01/14 2318  . potassium chloride SA (K-DUR,KLOR-CON) CR tablet 40 mEq  40 mEq Oral BID Standley Brooking, MD      . SUMAtriptan [facility]) tablet 100 mg  100 mg Oral Q2H PRN Kathlen Mody, MD   100 mg at 10/01/14 0309  . zolpidem (AMBIEN) tablet 5 mg  5 mg Oral QHS PRN Standley Brooking, MD   5 mg at 10/01/14 2334    Allergies as of 09/30/2014 - Review Complete 09/30/2014  Allergen Reaction Noted  . Penicillins  08/06/2012    Family History  Problem Relation Age of Onset  . Diabetes Father   . Diabetes Paternal Grandfather   . Diabetes Maternal Grandfather   . Colon cancer Neg Hx   . Esophageal cancer Neg Hx   . Stomach cancer Neg Hx   . Rectal cancer Neg Hx     History   Social History  . Marital Status: Married    Spouse Name: N/A    Number of Children: 1  . Years of Education: N/A   Occupational History  . SALES    Social History Main Topics  .  Smoking status: Former Smoker    Types: Cigarettes    Quit date: 04/10/2009  . Smokeless tobacco: Never Used  . Alcohol Use: No  . Drug Use: No  . Sexual Activity: Yes   Other Topics Concern  . Not on file   Social History Narrative    Review of Systems: Ten point ROS is O/W  negative except as mentioned in HPI.  Physical Exam: Vital signs in last 24 hours: Temp:  [98.2 F (36.8 C)] 98.2 F (36.8 C) (11/30 0552) Pulse Rate:  [45-77] 45 (11/30 0552) Resp:  [18] 18 (11/30 0552) BP: (108-115)/(67-78) 108/67 mmHg (11/30 0552) SpO2:  [98 %-99 %] 98 % (11/30 0552) Last BM Date: 10/01/14 General:  Alert, Well-developed, well-nourished, pleasant and cooperative in NAD Head:  Normocephalic and atraumatic. Eyes:  Sclera clear, no icterus.  Conjunctiva pink. Ears:  Normal auditory acuity. Mouth:  No deformity or lesions.   Lungs:  Clear throughout to auscultation.   No wheezes, crackles, or rhonchi.  Heart:  Regular rate and rhythm; no murmurs, clicks, rubs, or gallops. Abdomen:  Soft, non-distended.  BS present.  Upper abdominal TTP without R/R/G.   Rectal:  Deferred  Msk:  Symmetrical without gross deformities. Pulses:  Normal pulses noted. Extremities:  Without clubbing or edema. Neurologic:  Alert and  oriented x4;  grossly normal neurologically. Skin:  Intact without significant lesions or rashes. Psych:  Alert and cooperative. Normal mood and affect.  Very anxious!  Intake/Output from previous day: 11/29 0701 - 11/30 0700 In: 797.5 [P.O.:180; I.V.:617.5] Out: 1000 [Urine:1000] Lab Results:  Recent Labs  09/30/14 1157  WBC 6.9  HGB 14.1  HCT 42.1  PLT 175   BMET  Recent Labs  09/30/14 1157 10/02/14 0522  NA 140 142  K 3.8 3.3*  CL 100 103  CO2 26 28  GLUCOSE 106* 106*  BUN 16 7  CREATININE 0.79 0.94  CALCIUM 10.0 8.9   LFT  Recent Labs  09/30/14 1157  PROT 7.8  ALBUMIN 4.5  AST 16  ALT 20  ALKPHOS 54  BILITOT 1.0   Studies/Results: Dg Abd  2 Views  09/30/2014   CLINICAL DATA:  Acute onset of left lower quadrant abdominal pain for 1 day. Nausea and vomiting. Initial encounter.  EXAM: ABDOMEN - 2 VIEW  COMPARISON:  CT of the abdomen and pelvis from 08/09/2014, and abdominal radiograph performed 08/06/2012  FINDINGS: The visualized bowel gas pattern is unremarkable. Scattered air and stool filled loops of colon are seen; no abnormal dilatation of small bowel loops is seen to suggest small bowel obstruction. No free intra-abdominal air is identified on the provided upright view.  The visualized osseous structures are within normal limits; the sacroiliac joints are unremarkable in appearance. The visualized lung bases are essentially clear.  IMPRESSION: Unremarkable bowel gas pattern; no free intra-abdominal air seen. Small amount of stool noted in the colon.   Electronically Signed   By: Roanna RaiderJeffery  Chang M.D.   On: 09/30/2014 21:57    IMPRESSION:  -Nausea, vomiting and abdominal pain:  Probably secondary to worsening of gastroparesis:  On Reglan 10 mg IV ACHS, Zofran 4 mg, PPI IV BID, and Bentyl 20 mg four times per day.  ? If some of this is anxiety related/psychogenic. -H pylori: Was non-compliant with treatment as outpatient due to cost.  Has been started on flagyl, biaxin, and BID PPI here. -Anxiety, Tourette's syndrome, insomnia -Chronic pain syndrome maintained on narcotics for knee pain at pain clinic  PLAN: -Hydration, anti-emetics, continue IV Reglan 10 mg ACHS.  -Clear liquid diet and advance as tolerated. -Minimize narcotics as this is likely contributing to/worsening his symptoms. -Will discontinue Biaxin and flagyl for now as these can sometimes cause nausea and vomiting/not tolerated well.  Will treat H pylori at a later date (H pylori is not likely causing any of his symptoms).  ZEHR, JESSICA D.  10/02/2014, 8:29 AM  Pager number [phone]      Attending physician's note   I have taken a history, examined the patient  and reviewed the chart. I agree with the Advanced Practitioner's note, impression and recommendations. 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  IV metoclopramide, IV PPI, IV ondansetron for now. Consider adding IV promethazine if N/V persists. Minimize narcotics. Defer H pylori treatment until GI symptoms under good control as antibiotics could worsen his current GI symptoms. Severe anxiety-recommend primary service consults psychiatry.  Meryl Dare, MD Clementeen Graham

## 2014-10-02 NOTE — Progress Notes (Signed)
CARE MANAGEMENT NOTE 10/02/2014  Patient:  Shane Curry,Shane Curry   Account Number:  [id number]  Date Initiated:  10/02/2014  Documentation initiated by:  Ferdinand CavaSCHETTINO,Willine Schwalbe  Subjective/Objective Assessment:   38 yo male from home admitted with gastroparesis     Action/Plan:   discharge planning   Anticipated DC Date:  10/04/2014   Anticipated DC Plan:  HOME/SELF CARE      DC Planning Services  CM consult  PCP issues      Choice offered to / List presented to:             Status of service:  In process, will continue to follow Medicare Important Message given?   (If response is "NO", the following Medicare IM given date fields will be blank) Date Medicare IM given:   Medicare IM given by:   Date Additional Medicare IM given:   Additional Medicare IM given by:    Discharge Disposition:  HOME/SELF CARE  Per UR Regulation:    If discussed at Long Length of Stay Meetings, dates discussed:    Comments:  10/02/14 Ferdinand CavaAndrea Schettino RN BSN CM [phone] [id number] Patient stated that due to illness he has lost employment and health insurance yet he doesn't qualify for any assistance and is unable to afford medication and PCP visits. Provided the patient with 5 page resource [place]. Provided a pamphlet for the [facility] and encouraged the patient to call and schedule appointment with PCP and financial counselor for affordability of follow up and continued care. No orders, will continue to follow

## 2014-10-03 DIAGNOSIS — R112 Nausea with vomiting, unspecified: Secondary | ICD-10-CM

## 2014-10-03 MED ORDER — GI COCKTAIL ~~LOC~~
30.0000 mL | Freq: Three times a day (TID) | ORAL | Status: DC | PRN
  Administered 2014-10-03 – 2014-10-06 (×8): 30 mL via ORAL
  Filled 2014-10-03 (×10): qty 30

## 2014-10-03 NOTE — Progress Notes (Signed)
     [facility] Progress Note  Subjective:  Nausea is somewhat improved, but still complaining of a lot of abdominal pain.  Describes it as burning and constricting.   Objective:  Vital signs in last 24 hours: Temp:  [97.5 F (36.4 C)-98.2 F (36.8 C)] 98 F (36.7 C) (12/01 0505) Pulse Rate:  [46-54] 49 (12/01 0505) Resp:  [18] 18 (12/01 0505) BP: (107-135)/(65-80) 107/65 mmHg (12/01 0505) SpO2:  [97 %-100 %] 97 % (12/01 0505) Last BM Date: 10/01/14 General:  Alert, thin, in NAD; anxious Heart:  Bradycardic; no murmurs Pulm:  CTAB.  No W/R/R. Abdomen:  Soft, non-distended.  BS present.  Mild epigastric TTP without R/R/G. Extremities:  Without edema. Neurologic:  Alert and  oriented x4;  grossly normal neurologically. Psych:  Alert and cooperative. Anxious.  Intake/Output from previous day: 11/30 0701 - 12/01 0700 In: 240 [P.O.:240] Out: -   Lab Results:  Recent Labs  09/30/14 1157  WBC 6.9  HGB 14.1  HCT 42.1  PLT 175   BMET  Recent Labs  09/30/14 1157 10/02/14 0522  NA 140 142  K 3.8 3.3*  CL 100 103  CO2 26 28  GLUCOSE 106* 106*  BUN 16 7  CREATININE 0.79 0.94  CALCIUM 10.0 8.9   LFT  Recent Labs  09/30/14 1157  PROT 7.8  ALBUMIN 4.5  AST 16  ALT 20  ALKPHOS 54  BILITOT 1.0   Assessment / Plan: *Nausea, vomiting and abdominal pain: Probably secondary to worsening of gastroparesis: On Reglan 10 mg IV ACHS, Zofran 4 mg, PPI IV BID, and Bentyl 20 mg four times per day. ? 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 *H pylori:  Was non-compliant with treatment as outpatient due to cost.  Will treat at a later date once acute symptoms improved/resolved. *Anxiety, Tourette's syndrome, insomnia *Chronic pain syndrome maintained on narcotics for knee pain at pain clinic  -Hydration, anti-emetics, continue IV Reglan 10 mg ACHS.  -Clear liquid diet and advance as tolerated. -Minimize  narcotics as this is likely contributing to/worsening his symptoms. -Biaxin and Flagyl discontinued for now as these can sometimes cause nausea and vomiting/not tolerated well. Will treat H pylori at a later date (H pylori is not likely causing any of his symptoms). -Primary service to manage anxiety which is contributing to his symptoms-consider psychiatry consult. -Will add GI cocktail TID to see if this helps.  He thinks it may have helped when he got a dose in the past.  Could consider carafate as well.   LOS: 3 days   ZEHR, JESSICA D.  10/03/2014, 9:14 AM  Pager number [phone]     Attending physician's note   I have taken an interval history, reviewed the chart and examined the patient. I agree with the Advanced Practitioner's note, impression and recommendations.   Venita LickMalcolm T. Russella DarStark, MD [facility]

## 2014-10-03 NOTE — Progress Notes (Signed)
PROGRESS NOTE  Alesia MorinRoss M Leeson ZOX:[id number] DOB: 1976-05-21 DOA: 09/30/2014 PCP: Tally DueGUEST, [name], MD  GI: Dr. Rhea BeltonPyrtle  Summary: 38 year old man with history of gastroparesis of unclear etiology presented with nausea, vomiting and abdominal pain for 48 hours. Symptoms and failed to improve in the emergency department and he was admitted for further evaluation and treatment; symptoms were presumed to be secondary to gastroparesis. No fever, diarrhea.  Assessment/Plan: 1. Nausea, vomiting, abdominal pain- Suspect flare of gastroparesis, consider untreated H. pylori. He has been noncompliant with scheduled Reglan, taking only PRN. Overall some improvement. Recommendations per GI. 2. Gastroparesis of unclear etiology- Continue Reglan scheduled. Management per GI. 3. H. pylori. Noncompliant with treatment recommended secondary to finances. GI has recommended holding off on treatment until his acute vomiting abdominal pain resolves. 4. Diagnosed with ascending colitis by CT of the abdomen and pelvis. Admitted by GI 10/8, treated with empiric antibiotics, S/p unremarkable colonosopy 08/11/2014.TSH normal, celiac testing unremarkable, biopsies negative for microscopic colitis. Per GI note 10/27 some concern for psychogenic symptoms secondary to severe anxiety, also narcotic bowel syndrome, consider migraine. Started on Prilosec, Flexeril. Outpatient EGD 10/30 revealed significant retained food in the stomach consistent with gastroparesis, and patient was started on Reglan 3 times a day and daily at bedtime. Referred for neurology evalution to assess for migraine complex with abdominal pain. Also referred for outpatient psych consultation. 5. Anxiety-  Tourette's syndrome, insomnia. Continue with xanax 1-2 mg bid prn. Discussed the option for SSRI including Cymbalta,Patient follows outpatient neurology for tourette's syndrome, and wants to discuss with him as outpatient. Also discussed the alternative  therapies to control the chronic pain/ anxiety including acupuncture and mindfulness meditation.   6. Chronic pain syndrome-  maintained on narcotics for knee pain, followed by pain clinic.     Code Status: full DVT prophylaxis: Lovenox Family Communication: none present Disposition Plan: home   Meredeth IdeGagan S Lama, MD  [facility]  Pager [phone] If 7PM-7AM, please contact night-coverage at [email or web address], password [email or web address] 10/03/2014, 1:48 PM  LOS: 3 days   Consultants:    Procedures:    Antibiotics:    HPI/Subjective:   Patient continues to have the abdominal pain.   Objective: Filed Vitals:   10/02/14 0552 10/02/14 1533 10/02/14 2123 10/03/14 0505  BP: 108/67 130/80 135/75 107/65  Pulse: 45 46 54 49  Temp: 98.2 F (36.8 C) 97.5 F (36.4 C) 98.2 F (36.8 C) 98 F (36.7 C)  TempSrc: Oral Axillary Oral Oral  Resp: 18 18 18 18   Height:      SpO2: 98% 98% 100% 97%    Intake/Output Summary (Last 24 hours) at 10/03/14 1348 Last data filed at 10/03/14 1100  Gross per 24 hour  Intake    248 ml  Output      0 ml  Net    248 ml     There were no vitals filed for this visit.  Exam:    Physical Exam: Eyes: No icterus, extraocular muscles intact  Lungs: Normal respiratory effort, bilateral clear to auscultation, no crackles or wheezes.  Heart: Regular rate and rhythm, S1 and S2 normal, no murmurs, rubs auscultated Abdomen: BS normoactive,soft,nondistended,non-tender to palpation,no organomegaly Extremities: No pretibial edema, no erythema, no cyanosis, no clubbing Neuro : Alert and oriented to time, place and person, No focal deficits   Data Reviewed:  Potassium 3.3. Basic metabolic panel otherwise unremarkable.  Scheduled Meds: . alprazolam  1-2 mg Oral BID  . dicyclomine  20 mg Oral TID AC &  HS  . enoxaparin (LOVENOX) injection  40 mg Subcutaneous Q24H  . metoCLOPramide  10 mg Oral TID AC & HS  . montelukast  10 mg Oral QHS  . ondansetron (ZOFRAN)  IV  4 mg Intravenous 4 times per day  . pantoprazole  40 mg Oral BID   Continuous Infusions:    Principal Problem:   Nausea and vomiting Active Problems:   Gastroparesis   Abdominal pain, epigastric   H. pylori infection   Anxiety   Chronic pain syndrome   Time spent 25 minutes

## 2014-10-04 MED ORDER — ENSURE COMPLETE PO LIQD
237.0000 mL | Freq: Two times a day (BID) | ORAL | Status: DC
  Administered 2014-10-04 (×2): 237 mL via ORAL

## 2014-10-04 MED ORDER — ZOLPIDEM TARTRATE 10 MG PO TABS
10.0000 mg | ORAL_TABLET | Freq: Every evening | ORAL | Status: DC | PRN
  Administered 2014-10-04 – 2014-10-05 (×2): 10 mg via ORAL
  Filled 2014-10-04 (×2): qty 1

## 2014-10-04 MED ORDER — ENSURE PUDDING PO PUDG
1.0000 | Freq: Three times a day (TID) | ORAL | Status: DC
  Filled 2014-10-04 (×3): qty 1

## 2014-10-04 NOTE — Progress Notes (Addendum)
PROGRESS NOTE  Shane Curry [id number] DOB: July 28, 1976 DOA: 09/30/2014 PCP: Tally Curry, Shane WARREN, MD  GI: Dr. Rhea Curry  Summary: 38 year old man with history of gastroparesis of unclear etiology presented with nausea, vomiting and abdominal pain for 48 hours. Symptoms and failed to improve in the emergency department and he was admitted for further evaluation and treatment; symptoms were presumed to be secondary to gastroparesis. No fever, diarrhea.   HPI/Subjective: Having ongoing abdominal pain- agreeable to try full liquids and start dietary supplements  Assessment/Plan: 1. Nausea, vomiting, abdominal pain-  flare of gastroparesis etiology of which has been undetermined, appreciate GI eval-will advance to full liquids-add dietary supplements to prevent further weight loss - GI recommends completing treatment for H. pylori once acute issues resolve  2. Gastroparesis of unclear etiology- see above 3. H. pylori. Noncompliant with treatment secondary to finances. GI has recommended holding off on treatment until his acute vomiting abdominal pain resolves. 4. Ascending colitis by CT of the abdomen and pelvis. Admitted by GI 10/8, treated with empiric antibiotics, S/p unremarkable colonosopy 08/11/2014.TSH normal, celiac testing unremarkable, biopsies negative for microscopic colitis. Per GI note 10/27 some concern for psychogenic symptoms secondary to severe anxiety, also narcotic bowel syndrome. Started on Prilosec, Flexeril. Outpatient EGD 10/30 revealed significant retained food in the stomach consistent with gastroparesis, and patient was started on Reglan 3 times a day and daily at bedtime. Referred for neurology evalution to assess for migraine complex with abdominal pain. Also referred for outpatient psych consultation.-  5. Anxiety-  Tourette's syndrome, insomnia. Continue with xanax 1-2 mg bid prn- he feels that his anxiety is currently under control. Dr Shane Curry also discussed the  alternative therapies to control the chronic pain/ anxiety including acupuncture and mindfulness meditation.   6. Chronic pain syndrome-  maintained on narcotics for knee pain, followed by pain clinic.    Code Status: full DVT prophylaxis: Lovenox Family Communication: none present Disposition Plan: home   Shane CantorSaima Marzetta Lanza, MD  [facility]  Pager  [email or web address], password [email or web address] 10/04/2014, 3:15 PM  LOS: 4 days   Consultants:  GI  Procedures:  none   Objective: Filed Vitals:   10/03/14 0505 10/03/14 1456 10/03/14 2148 10/04/14 0543  BP: 107/65 123/77 113/82 109/66  Pulse: 49 50 97 48  Temp: 98 F (36.7 C) 97.9 F (36.6 C) 98 F (36.7 C) 97.8 F (36.6 C)  TempSrc: Oral Oral Oral Oral  Resp: 18 18 18 18   Height:      SpO2: 97% 99% 96% 96%    Intake/Output Summary (Last 24 hours) at 10/04/14 1515 Last data filed at 10/03/14 1816  Gross per 24 hour  Intake    360 ml  Output      0 ml  Net    360 ml     There were no vitals filed for this visit.  Exam:    Physical Exam: Eyes: No icterus, extraocular muscles intact  Lungs: Normal respiratory effort, bilateral clear to auscultation, no crackles or wheezes.  Heart: Regular rate and rhythm, S1 and S2 normal, no murmurs, rubs auscultated Abdomen: BS normoactive,soft,nondistended,non-tender to palpation,no organomegaly Extremities: No pretibial edema, no erythema, no cyanosis, no clubbing Neuro : Alert and oriented to time, place and person, No focal deficits   Data Reviewed:  Potassium 3.3. Basic metabolic panel otherwise unremarkable.  Scheduled Meds: . alprazolam  1-2 mg Oral BID  . dicyclomine  20 mg Oral TID AC & HS  . enoxaparin (LOVENOX) injection  40 mg Subcutaneous Q24H  .  feeding supplement (ENSURE COMPLETE)  237 mL Oral BID BM  . metoCLOPramide  10 mg Oral TID AC & HS  . montelukast  10 mg Oral QHS  . ondansetron (ZOFRAN) IV  4 mg Intravenous 4 times per day  . pantoprazole  40 mg Oral BID    Continuous Infusions:      Time spent 25 minutes

## 2014-10-04 NOTE — Progress Notes (Addendum)
    Progress Note   Subjective  Feels a little better today. Abdominal burning better with GI cocktail. Nausea controlled with Zofran. He wants a muffin   Objective   Vital signs in last 24 hours: Temp:  [97.8 F (36.6 C)-98 F (36.7 C)] 97.8 F (36.6 C) (12/02 0543) Pulse Rate:  [48-97] 48 (12/02 0543) Resp:  [18] 18 (12/02 0543) BP: (109-123)/(66-82) 109/66 mmHg (12/02 0543) SpO2:  [96 %-99 %] 96 % (12/02 0543) Last BM Date: 10/01/14 General:    Thin white male in NAD Abdomen:  Soft, nontender and nondistended. Normal bowel sounds. Extremities:  Without edema. Neurologic:  Alert and oriented,  grossly normal neurologically. Psych:  Cooperative. Normal mood and affect.  BMET  Recent Labs  10/02/14 0522  NA 142  K 3.3*  CL 103  CO2 28  GLUCOSE 106*  BUN 7  CREATININE 0.94  CALCIUM 8.9      Assessment / Plan:   401. 38 year old male with nausea, vomiting, abdominal pain. He had was admitted a few months back for same symptoms in setting of colitis on CTscan but colonoscopy was normal. On upper endoscopy he had significant amount of retained food in stomach and H.pylori (he didn't complete treatment).  Labs and plain abdominal films this admission are unremarkable. He is off IV narcotics, back on home PO narcotics and states he feels better. Wants a muffin. Continue GI cocktail. If tolerates fulls for lunch will try soft diet. Hopefully home soon. Ideally he would get off narcotics all together.   2. Chronic pain. On chronic daily narcotics at home.    LOS: 4 days   Willette Clusteraula Guenther  10/04/2014, 9:16 AM     Attending physician's note   I have taken an interval history, reviewed the chart and examined the patient. I agree with the Advanced Practitioner's note, impression and recommendations. Steadily improving with current medication regimen. Advance diet slowly as tolerated to a soft,  gastroparesis diet. Continue all current GI medications. Try to change Zofran to PO.  Hopefully will be ready for discharge soon.   Venita LickMalcolm T. Russella DarStark, MD [facility]

## 2014-10-05 DIAGNOSIS — R1084 Generalized abdominal pain: Secondary | ICD-10-CM

## 2014-10-05 LAB — BASIC METABOLIC PANEL
ANION GAP: 10 (ref 5–15)
BUN: 9 mg/dL (ref 6–23)
CHLORIDE: 104 meq/L (ref 96–112)
CO2: 32 mEq/L (ref 19–32)
CREATININE: 1.1 mg/dL (ref 0.50–1.35)
Calcium: 9.2 mg/dL (ref 8.4–10.5)
GFR calc Af Amer: >90 mL/min (ref >90)
GFR calc non Af Amer: 84 mL/min — ABNORMAL LOW (ref >90)
Glucose, Bld: 101 mg/dL — ABNORMAL HIGH (ref 70–99)
Potassium: 5 mEq/L (ref 3.7–5.3)
Sodium: 146 mEq/L (ref 137–147)

## 2014-10-05 MED ORDER — ENSURE COMPLETE PO LIQD
237.0000 mL | Freq: Three times a day (TID) | ORAL | Status: DC
  Administered 2014-10-05 – 2014-10-06 (×3): 237 mL via ORAL

## 2014-10-05 NOTE — Progress Notes (Signed)
    Progress Note   Subjective  Was able to get more rest last night. Abdominal pain improving. He is still having periods of significant nausea without vomiting.    Objective   Vital signs in last 24 hours: Temp:  [98 F (36.7 C)-98.4 F (36.9 C)] 98 F (36.7 C) (12/03 0529) Pulse Rate:  [45-65] 45 (12/03 0529) Resp:  [16] 16 (12/03 0529) BP: (109-111)/(63-73) 111/73 mmHg (12/03 0529) SpO2:  [96 %-99 %] 98 % (12/03 0529) Last BM Date: 09/30/14 General:    white male in NAD Heart:  Regular rate and rhythm; no murmurs Lungs: Respirations even and unlabored, lungs CTA bilaterally Abdomen:  Soft, nontender and nondistended. Normal bowel sounds. Extremities:  Without edema. Neurologic:  Alert and oriented,  grossly normal neurologically. Psych:  Cooperative. Normal mood and affect.    Lab Results:  BMET  Recent Labs  10/05/14 0435  NA 146  K 5.0  CL 104  CO2 32  GLUCOSE 101*  BUN 9  CREATININE 1.10  CALCIUM 9.2      Assessment / Plan:    38 year old male with documented gastroparesis and chronic, intermittent abdominal pain. Pain improving on current regimen but still having periods of significant nausea. Tolerated breakfast. Encouraged small frequent meals. Continue GI cocktail and ac Reglan. Hopefully home soon   LOS: 5 days   Willette Clusteraula Guenther  10/05/2014, 11:50 AM     Attending physician's note   I have taken an interval history, reviewed the chart and examined the patient. I agree with the Advanced Practitioner's note, impression and recommendations. He is making steady progress with his GI symptoms. If he can tolerate a soft, gastroparesis diet with reasonable symptoms control he can be discharged. He is anxiety is not under good control which is exacerbating his GI symptoms-defer to primary service.   Venita LickMalcolm T. Russella DarStark, MD [facility]

## 2014-10-05 NOTE — Plan of Care (Signed)
Problem: Consults Goal: General Medical Patient Education See Patient Education Module for specific education.  Outcome: Completed/Met Date Met:  10/05/14 Goal: Skin Care Protocol Initiated - if Braden Score 18 or less If consults are not indicated, leave blank or document N/A  Outcome: Completed/Met Date Met:  10/05/14 Goal: Nutrition Consult-if indicated Outcome: Completed/Met Date Met:  10/05/14 Goal: Diabetes Guidelines if Diabetic/Glucose > 140 If diabetic or lab glucose is > 140 mg/dl - Initiate Diabetes/Hyperglycemia Guidelines & Document Interventions  Outcome: Not Applicable Date Met:  12/03/41  Problem: Phase I Progression Outcomes Goal: Pain controlled with appropriate interventions Outcome: Progressing Goal: OOB as tolerated unless otherwise ordered Outcome: Progressing Goal: Initial discharge plan identified Outcome: Progressing Goal: Voiding-avoid urinary catheter unless indicated Outcome: Completed/Met Date Met:  10/05/14 Goal: Hemodynamically stable Outcome: Completed/Met Date Met:  10/05/14  Problem: Phase II Progression Outcomes Goal: Progress activity as tolerated unless otherwise ordered Outcome: Progressing Goal: Discharge plan established Outcome: Progressing Goal: Vital signs remain stable Outcome: Completed/Met Date Met:  10/05/14

## 2014-10-05 NOTE — Progress Notes (Signed)
PROGRESS NOTE  Alesia MorinRoss M Oglesby FAO:[id number] DOB: 08/12/1976 DOA: 09/30/2014 PCP: Tally DueGUEST, [name], MD  GI: Dr. Rhea BeltonPyrtle  Summary: 38 year old man with history of gastroparesis of unclear etiology presented with nausea, vomiting and abdominal pain for 48 hours. Symptoms and failed to improve in the emergency department and he was admitted for further evaluation and treatment; symptoms were presumed to be secondary to gastroparesis. No fever, diarrhea.   HPI/Subjective: Pain improving-tolerating diet and Ensure  Assessment/Plan: 1. Nausea, vomiting, abdominal pain-  flare of gastroparesis etiology of which has been undetermined, appreciate GI eval-symptoms steadily improving-continue soft diet-increase Ensure to 3 times a day to prevent further weight loss - GI recommends completing treatment for H. pylori once acute issues resolve  2. Gastroparesis of unclear etiology- see above 3. H. pylori. Noncompliant with treatment secondary to finances. GI has recommended holding off on treatment until his acute vomiting abdominal pain resolves. 4. Ascending colitis by CT of the abdomen and pelvis. Admitted by GI 10/8, treated with empiric antibiotics, S/p unremarkable colonosopy 08/11/2014.TSH normal, celiac testing unremarkable, biopsies negative for microscopic colitis. Per GI note 10/27 some concern for psychogenic symptoms secondary to severe anxiety, also narcotic bowel syndrome. Started on Prilosec, Flexeril. Outpatient EGD 10/30 revealed significant retained food in the stomach consistent with gastroparesis, and patient was started on Reglan 3 times a day and daily at bedtime. Referred for neurology evalution to assess for migraine complex with abdominal pain. Also referred for outpatient psych consultation.-  5. Anxiety-  Tourette's syndrome, insomnia. Continue with xanax 1-2 mg bid prn- he feels that his anxiety is currently under control. Dr Sharl MaLama also discussed the alternative therapies to  control the chronic pain/ anxiety including acupuncture and mindfulness meditation.   6. Chronic pain syndrome-  maintained on narcotics for knee pain, followed by pain clinic. 7. Severe protein calorie malnutrition-continue Ensure    Code Status: full DVT prophylaxis: Lovenox Family Communication: none present Disposition Plan: home   Calvert CantorSaima Daphene Chisholm, MD  [facility]  Pager  [email or web address], password [email or web address] 10/05/2014, 12:13 PM  LOS: 5 days   Consultants:  GI  Procedures:  none   Objective: Filed Vitals:   10/04/14 0543 10/04/14 1500 10/04/14 2113 10/05/14 0529  BP: 109/66 109/63 111/70 111/73  Pulse: 48 65 56 45  Temp: 97.8 F (36.6 C) 98.2 F (36.8 C) 98.4 F (36.9 C) 98 F (36.7 C)  TempSrc: Oral Oral Oral Oral  Resp: 18 16 16 16   Height:      SpO2: 96% 99% 96% 98%    Intake/Output Summary (Last 24 hours) at 10/05/14 1213 Last data filed at 10/04/14 1700  Gross per 24 hour  Intake    240 ml  Output      0 ml  Net    240 ml     There were no vitals filed for this visit.  Exam:    Physical Exam: Eyes: No icterus, extraocular muscles intact  Lungs: Normal respiratory effort, bilateral clear to auscultation, no crackles or wheezes.  Heart: Regular rate and rhythm, S1 and S2 normal, no murmurs, rubs auscultated Abdomen: BS normoactive,soft,nondistended,non-tender to palpation,no organomegaly Extremities: No pretibial edema, no erythema, no cyanosis, no clubbing Neuro : Alert and oriented to time, place and person, No focal deficits   Data Reviewed:  Potassium 3.3. Basic metabolic panel otherwise unremarkable.  Scheduled Meds: . alprazolam  1-2 mg Oral BID  . dicyclomine  20 mg Oral TID AC & HS  . enoxaparin (LOVENOX) injection  40 mg  Subcutaneous Q24H  . feeding supplement (ENSURE COMPLETE)  237 mL Oral TID BM  . metoCLOPramide  10 mg Oral TID AC & HS  . montelukast  10 mg Oral QHS  . ondansetron (ZOFRAN) IV  4 mg Intravenous 4 times per day  .  pantoprazole  40 mg Oral BID   Continuous Infusions:      Time spent 25 minutes

## 2014-10-06 MED ORDER — GI COCKTAIL ~~LOC~~: 30.0000 mL | Freq: Three times a day (TID) | ORAL | Status: DC | PRN

## 2014-10-06 MED ORDER — ZOLPIDEM TARTRATE 10 MG PO TABS: 10.0000 mg | ORAL_TABLET | Freq: Every evening | ORAL | Status: AC | PRN

## 2014-10-06 MED ORDER — MONTELUKAST SODIUM 10 MG PO TABS: 10.0000 mg | ORAL_TABLET | Freq: Every day | ORAL | Status: AC

## 2014-10-06 MED ORDER — OXYCODONE HCL 15 MG PO TABS: 15.0000 mg | ORAL_TABLET | ORAL | Status: DC | PRN

## 2014-10-06 MED ORDER — RIZATRIPTAN BENZOATE 10 MG PO TABS: 10.0000 mg | ORAL_TABLET | ORAL | Status: AC | PRN

## 2014-10-06 MED ORDER — ENSURE COMPLETE PO LIQD: 237.0000 mL | Freq: Three times a day (TID) | ORAL | Status: AC

## 2014-10-06 MED ORDER — CYCLOBENZAPRINE HCL 5 MG PO TABS: 5.0000 mg | ORAL_TABLET | Freq: Three times a day (TID) | ORAL | Status: DC | PRN

## 2014-10-06 MED ORDER — METOCLOPRAMIDE HCL 10 MG PO TABS: 10.0000 mg | ORAL_TABLET | Freq: Three times a day (TID) | ORAL | Status: DC

## 2014-10-06 MED ORDER — CLARITHROMYCIN 500 MG PO TABS: 500.0000 mg | ORAL_TABLET | Freq: Two times a day (BID) | ORAL | Status: DC

## 2014-10-06 MED ORDER — DICYCLOMINE HCL 20 MG PO TABS: 20.0000 mg | ORAL_TABLET | Freq: Three times a day (TID) | ORAL | Status: DC | PRN

## 2014-10-06 MED ORDER — OMEPRAZOLE 20 MG PO CPDR: 40.0000 mg | DELAYED_RELEASE_CAPSULE | Freq: Two times a day (BID) | ORAL | Status: AC

## 2014-10-06 NOTE — Progress Notes (Signed)
Patient given discharge instructions, and verbalized an understanding of all discharge instructions.  Patient agrees with discharge plan, and is being discharged in stable medical condition.  Patient ambulatory, and walked to transportation with wife.  Philomena Dohenyavid Fredrika Canby RN

## 2014-10-06 NOTE — Discharge Summary (Signed)
Physician Discharge Summary  Shane Curry WUJ:[id number] DOB: [birth year] DOA: 09/30/2014  PCP: Tally Due, MD  Admit date: 09/30/2014 Discharge date: 10/06/2014  Time spent: 45 minutes   Discharge Condition: stable Diet recommendation: regular diet  Discharge Diagnoses:  Principal Problem:   Nausea and vomiting Active Problems:   Gastroparesis   Abdominal pain, epigastric   H. pylori infection   Anxiety   Chronic pain syndrome   Abdominal pain, generalized   History of present illness:  38 year old man with history of gastroparesis of unclear etiology presented with nausea, vomiting and abdominal pain for 48 hours. Symptoms and failed to improve in the emergency department and he was admitted for further evaluation and treatment; symptoms were presumed to be secondary to gastroparesis. No fever, diarrhea.   Hospital Course:  1. Nausea, vomiting, abdominal pain- flare of gastroparesis etiology of which has been undetermined, appreciate GI eval-symptoms steadily improving - now tolerating a regular diet -increased Ensure to 3 times a day to prevent further weight loss - GI recommends completing treatment for H. pylori once acute issues resolve  2. Gastroparesis of unclear etiology- see above 3. H. pylori. Noncompliant with treatment secondary to finances. GI has recommended holding off on treatment until his acute vomiting abdominal pain resolves. 4. Ascending colitis by CT of the abdomen and pelvis. Admitted by GI 10/8, treated with empiric antibiotics, S/p unremarkable colonosopy 08/11/2014.TSH normal, celiac testing unremarkable, biopsies negative for microscopic colitis. Per GI note 10/27 some concern for psychogenic symptoms secondary to severe anxiety, also narcotic bowel syndrome. Started on Prilosec, Flexeril. Outpatient EGD 10/30 revealed significant retained food in the stomach consistent with gastroparesis, and patient was started on Reglan 3 times a day and  daily at bedtime. Referred for neurology evalution to assess for migraine complex with abdominal pain. Also referred for outpatient psych consultation.-  5. Anxiety- Tourette's syndrome, insomnia. Continue with xanax 1-2 mg bid prn- he feels that his anxiety is currently under control. Dr Sharl Ma also discussed the alternative therapies to control the chronic pain/ anxiety including acupuncture and mindfulness meditation.  6. Chronic pain syndrome- maintained on narcotics for knee pain, followed by pain clinic. 7. Severe protein calorie malnutrition-continue Ensure 8. Insomnia- Ambien prescribed- it was effective during the hospital stay   Procedures:  none  Consultations:  GI  Discharge Exam: There were no vitals filed for this visit. Filed Vitals:   10/06/14 0605  BP: 99/71  Pulse: 63  Temp: 98.3 F (36.8 C)  Resp: 18    General: AAO x 3, no distress Cardiovascular: RRR, no murmurs  Respiratory: clear to auscultation bilaterally GI: soft, mild tenderness in epigastrium, non-distended, bowel sound positive  Discharge Instructions You were cared for by a hospitalist during your hospital stay. If you have any questions about your discharge medications or the care you received while you were in the hospital after you are discharged, you can call the unit and asked to speak with the hospitalist on call if the hospitalist that took care of you is not available. Once you are discharged, your primary care physician will handle any further medical issues. Please note that NO REFILLS for any discharge medications will be authorized once you are discharged, as it is imperative that you return to your primary care physician (or establish a relationship with a primary care physician if you do not have one) for your aftercare needs so that they can reassess your need for medications and monitor your lab values.  Discharge Instructions    Diet - low sodium heart healthy    Complete by:   As directed      Increase activity slowly    Complete by:  As directed             Medication List    STOP taking these medications        hydrocortisone 2.5 % rectal cream  Commonly known as:  ANUSOL-HC      TAKE these medications        alprazolam 2 MG tablet  Commonly known as:  XANAX  Take 1-2 mg by mouth 2 (two) times daily.     clarithromycin 500 MG tablet  Commonly known as:  BIAXIN  Take 1 tablet (500 mg total) by mouth 2 (two) times daily.     cyclobenzaprine 5 MG tablet  Commonly known as:  FLEXERIL  Take 1 tablet (5 mg total) by mouth 3 (three) times daily as needed for muscle spasms.     dicyclomine 20 MG tablet  Commonly known as:  BENTYL  Take 1 tablet (20 mg total) by mouth 3 (three) times daily as needed for spasms.     feeding supplement (ENSURE COMPLETE) Liqd  Take 237 mLs by mouth 3 (three) times daily between meals.     gi cocktail Susp suspension  Take 30 mLs by mouth 3 (three) times daily as needed for indigestion. Shake well.     metoCLOPramide 10 MG tablet  Commonly known as:  REGLAN  Take 1 tablet (10 mg total) by mouth 4 (four) times daily -  before meals and at bedtime.     montelukast 10 MG tablet  Commonly known as:  SINGULAIR  Take 1 tablet (10 mg total) by mouth at bedtime.     omeprazole 20 MG capsule  Commonly known as:  PRILOSEC  Take 2 capsules (40 mg total) by mouth 2 (two) times daily before a meal.     oxyCODONE 15 MG immediate release tablet  Commonly known as:  ROXICODONE  Take 1 tablet (15 mg total) by mouth every 4 (four) hours as needed for pain.     rizatriptan 10 MG tablet  Commonly known as:  MAXALT  Take 1 tablet (10 mg total) by mouth as needed for migraine (migraine). May repeat in 2 hours if needed     zolpidem 10 MG tablet  Commonly known as:  AMBIEN  Take 1 tablet (10 mg total) by mouth at bedtime as needed for sleep.       Allergies  Allergen Reactions  . Penicillins     Childhood reaction        The results of significant diagnostics from this hospitalization (including imaging, microbiology, ancillary and laboratory) are listed below for reference.    Significant Diagnostic Studies: Dg Abd 2 Views  09/30/2014   CLINICAL DATA:  Acute onset of left lower quadrant abdominal pain for 1 day. Nausea and vomiting. Initial encounter.  EXAM: ABDOMEN - 2 VIEW  COMPARISON:  CT of the abdomen and pelvis from 08/09/2014, and abdominal radiograph performed 08/06/2012  FINDINGS: The visualized bowel gas pattern is unremarkable. Scattered air and stool filled loops of colon are seen; no abnormal dilatation of small bowel loops is seen to suggest small bowel obstruction. No free intra-abdominal air is identified on the provided upright view.  The visualized osseous structures are within normal limits; the sacroiliac joints are unremarkable in appearance. The visualized lung bases are essentially clear.  IMPRESSION:  Unremarkable bowel gas pattern; no free intra-abdominal air seen. Small amount of stool noted in the colon.   Electronically Signed   By: Roanna RaiderJeffery  Chang M.D.   On: 09/30/2014 21:57    Microbiology: No results found for this or any previous visit (from the past 240 hour(s)).   Labs: Basic Metabolic Panel:  Recent Labs Lab 09/30/14 1157 10/02/14 0522 10/05/14 0435  NA 140 142 146  K 3.8 3.3* 5.0  CL 100 103 104  CO2 26 28 32  GLUCOSE 106* 106* 101*  BUN 16 7 9   CREATININE 0.79 0.94 1.10  CALCIUM 10.0 8.9 9.2   Liver Function Tests:  Recent Labs Lab 09/30/14 1157  AST 16  ALT 20  ALKPHOS 54  BILITOT 1.0  PROT 7.8  ALBUMIN 4.5    Recent Labs Lab 09/30/14 1157  LIPASE 24   No results for input(s): AMMONIA in the last 168 hours. CBC:  Recent Labs Lab 09/30/14 1157  WBC 6.9  NEUTROABS 5.4  HGB 14.1  HCT 42.1  MCV 85.7  PLT 175   Cardiac Enzymes: No results for input(s): CKTOTAL, CKMB, CKMBINDEX, TROPONINI in the last 168 hours. BNP: BNP (last 3  results) No results for input(s): PROBNP in the last 8760 hours. CBG: No results for input(s): GLUCAP in the last 168 hours.     SignedCalvert Cantor:  Shane Barstow, MD [facility] 10/06/2014, 2:07 PM

## 2014-10-06 NOTE — Progress Notes (Signed)
    Progress Note   Subjective  Overall better, still some nausea with certain food   Objective   Vital signs in last 24 hours: Temp:  [98.1 F (36.7 C)-98.3 F (36.8 C)] 98.3 F (36.8 C) (12/04 0605) Pulse Rate:  [49-63] 63 (12/04 0605) Resp:  [16-18] 18 (12/04 0605) BP: (99-110)/(64-71) 99/71 mmHg (12/04 0605) SpO2:  [95 %-99 %] 95 % (12/04 0605) Last BM Date: 09/30/14 General:    white male in NAD Lungs: Respirations even and unlabored, lungs CTA bilaterally Abdomen:  Soft, nontender and nondistended. Normal bowel sounds. Neurologic:  Alert and oriented,  grossly normal neurologically. Psych:  Cooperative. Normal mood and affect.    Recent Labs  10/05/14 0435  NA 146  K 5.0  CL 104  CO2 32  GLUCOSE 101*  BUN 9  CREATININE 1.10  CALCIUM 9.2     Assessment / Plan:    38 year old male with documented gastroparesis and chronic, intermittent abdominal pain. Pain improved but still having periods of nausea with certain foods. He is stable for discharge. Continue Reglan ac and hs, small frequent meals, Zofran as needed, Bentyl qid, Protonix bid at home. Follow up with Dr. Rhea BeltonPyrtle in 2 months.    LOS: 6 days   Willette Clusteraula Guenther  10/06/2014, 10:01 AM      Attending physician's note   I have taken an interval history, reviewed the chart and examined the patient. I agree with the Advanced Practitioner's note, impression and recommendations.   Venita LickMalcolm T. Russella DarStark, MD [facility]

## 2014-10-06 NOTE — Progress Notes (Signed)
10/06/14 Ferdinand CavaAndrea Schettino RN BSN CM [phone] [facility] an [facility] and he spoke with Clydie BraunKaren and Sedalia Mutaiane. He stated that they have his contact information and will call him back to schedule a new PCP appointment and a financial counselor appointment.

## 2014-10-06 NOTE — Plan of Care (Signed)
Problem: Discharge Progression Outcomes Goal: Discharge plan in place and appropriate Outcome: Completed/Met Date Met:  10/06/14 Goal: Pain controlled with appropriate interventions Outcome: Completed/Met Date Met:  10/06/14 Goal: Tolerating diet Outcome: Completed/Met Date Met:  10/06/14 Goal: Activity appropriate for discharge plan Outcome: Completed/Met Date Met:  10/06/14

## 2014-10-20 ENCOUNTER — Inpatient Hospital Stay (HOSPITAL_COMMUNITY)
Admission: EM | Admit: 2014-10-20 | Discharge: 2014-10-25 | DRG: 391 | Disposition: A | Discharge status: Home / Self Care | Payer: Medicaid Other | Attending: Internal Medicine | Admitting: Internal Medicine

## 2014-10-20 ENCOUNTER — Encounter (HOSPITAL_COMMUNITY): Payer: Self-pay | Admitting: *Deleted

## 2014-10-20 DIAGNOSIS — Z833 Family history of diabetes mellitus: Secondary | ICD-10-CM

## 2014-10-20 DIAGNOSIS — F952 Tourette's disorder: Secondary | ICD-10-CM | POA: Diagnosis present

## 2014-10-20 DIAGNOSIS — I4581 Long QT syndrome: Secondary | ICD-10-CM | POA: Diagnosis present

## 2014-10-20 DIAGNOSIS — R1033 Periumbilical pain: Secondary | ICD-10-CM | POA: Diagnosis present

## 2014-10-20 DIAGNOSIS — R9431 Abnormal electrocardiogram [ECG] [EKG]: Secondary | ICD-10-CM

## 2014-10-20 DIAGNOSIS — F1123 Opioid dependence with withdrawal: Secondary | ICD-10-CM | POA: Diagnosis present

## 2014-10-20 DIAGNOSIS — Z79891 Long term (current) use of opiate analgesic: Secondary | ICD-10-CM | POA: Diagnosis not present

## 2014-10-20 DIAGNOSIS — E43 Unspecified severe protein-calorie malnutrition: Secondary | ICD-10-CM | POA: Diagnosis present

## 2014-10-20 DIAGNOSIS — R7401 Elevation of levels of liver transaminase levels: Secondary | ICD-10-CM | POA: Diagnosis present

## 2014-10-20 DIAGNOSIS — M25562 Pain in left knee: Secondary | ICD-10-CM | POA: Diagnosis present

## 2014-10-20 DIAGNOSIS — F411 Generalized anxiety disorder: Secondary | ICD-10-CM | POA: Diagnosis present

## 2014-10-20 DIAGNOSIS — G43909 Migraine, unspecified, not intractable, without status migrainosus: Secondary | ICD-10-CM | POA: Diagnosis present

## 2014-10-20 DIAGNOSIS — M25561 Pain in right knee: Secondary | ICD-10-CM | POA: Diagnosis present

## 2014-10-20 DIAGNOSIS — K3184 Gastroparesis: Principal | ICD-10-CM | POA: Diagnosis present

## 2014-10-20 DIAGNOSIS — R740 Nonspecific elevation of levels of transaminase and lactic acid dehydrogenase [LDH]: Secondary | ICD-10-CM | POA: Diagnosis present

## 2014-10-20 DIAGNOSIS — R1084 Generalized abdominal pain: Secondary | ICD-10-CM

## 2014-10-20 DIAGNOSIS — Z87891 Personal history of nicotine dependence: Secondary | ICD-10-CM | POA: Diagnosis not present

## 2014-10-20 DIAGNOSIS — R109 Unspecified abdominal pain: Secondary | ICD-10-CM | POA: Diagnosis present

## 2014-10-20 DIAGNOSIS — G894 Chronic pain syndrome: Secondary | ICD-10-CM | POA: Diagnosis present

## 2014-10-20 DIAGNOSIS — R52 Pain, unspecified: Secondary | ICD-10-CM

## 2014-10-20 HISTORY — DX: Gastroparesis: K31.84

## 2014-10-20 LAB — COMPREHENSIVE METABOLIC PANEL
ALBUMIN: 4.8 g/dL (ref 3.5–5.2)
ALK PHOS: 77 U/L (ref 39–117)
ALT: 113 U/L — ABNORMAL HIGH (ref 0–53)
AST: 36 U/L (ref 0–37)
Anion gap: 16 — ABNORMAL HIGH (ref 5–15)
BILIRUBIN TOTAL: 0.8 mg/dL (ref 0.3–1.2)
BUN: 17 mg/dL (ref 6–23)
CO2: 27 mEq/L (ref 19–32)
Calcium: 10.6 mg/dL — ABNORMAL HIGH (ref 8.4–10.5)
Chloride: 97 mEq/L (ref 96–112)
Creatinine, Ser: 0.81 mg/dL (ref 0.50–1.35)
GFR calc Af Amer: >90 mL/min (ref >90)
GFR calc non Af Amer: >90 mL/min (ref >90)
Glucose, Bld: 114 mg/dL — ABNORMAL HIGH (ref 70–99)
POTASSIUM: 4.1 meq/L (ref 3.7–5.3)
Sodium: 140 mEq/L (ref 137–147)
TOTAL PROTEIN: 8.5 g/dL — AB (ref 6.0–8.3)

## 2014-10-20 LAB — CBC WITH DIFFERENTIAL/PLATELET
BASOS PCT: 0 % (ref 0–1)
Basophils Absolute: 0 10*3/uL (ref 0.0–0.1)
Eosinophils Absolute: 0.1 10*3/uL (ref 0.0–0.7)
Eosinophils Relative: 1 % (ref 0–5)
HCT: 46.4 % (ref 39.0–52.0)
HEMOGLOBIN: 15.3 g/dL (ref 13.0–17.0)
Lymphocytes Relative: 22 % (ref 12–46)
Lymphs Abs: 2 10*3/uL (ref 0.7–4.0)
MCH: 28.4 pg (ref 26.0–34.0)
MCHC: 33 g/dL (ref 30.0–36.0)
MCV: 86.2 fL (ref 78.0–100.0)
MONOS PCT: 7 % (ref 3–12)
Monocytes Absolute: 0.7 10*3/uL (ref 0.1–1.0)
NEUTROS ABS: 6.6 10*3/uL (ref 1.7–7.7)
NEUTROS PCT: 70 % (ref 43–77)
PLATELETS: 280 10*3/uL (ref 150–400)
RBC: 5.38 MIL/uL (ref 4.22–5.81)
RDW: 12.8 % (ref 11.5–15.5)
WBC: 9.4 10*3/uL (ref 4.0–10.5)

## 2014-10-20 LAB — URINE MICROSCOPIC-ADD ON

## 2014-10-20 LAB — URINALYSIS, ROUTINE W REFLEX MICROSCOPIC
Bilirubin Urine: NEGATIVE
Glucose, UA: NEGATIVE mg/dL
HGB URINE DIPSTICK: NEGATIVE
Ketones, ur: 15 mg/dL — AB
Leukocytes, UA: NEGATIVE
NITRITE: NEGATIVE
Protein, ur: 30 mg/dL — AB
SPECIFIC GRAVITY, URINE: 1.03 (ref 1.005–1.030)
UROBILINOGEN UA: 0.2 mg/dL (ref 0.0–1.0)
pH: 5.5 (ref 5.0–8.0)

## 2014-10-20 LAB — LIPASE, BLOOD: Lipase: 53 U/L (ref 11–59)

## 2014-10-20 MED ORDER — ONDANSETRON HCL 4 MG/2ML IJ SOLN
4.0000 mg | Freq: Once | INTRAMUSCULAR | Status: AC
  Administered 2014-10-20: 4 mg via INTRAVENOUS
  Filled 2014-10-20: qty 2

## 2014-10-20 MED ORDER — ENSURE COMPLETE PO LIQD
237.0000 mL | Freq: Three times a day (TID) | ORAL | Status: DC
  Administered 2014-10-21 – 2014-10-25 (×11): 237 mL via ORAL

## 2014-10-20 MED ORDER — ALPRAZOLAM 1 MG PO TABS
1.0000 mg | ORAL_TABLET | Freq: Two times a day (BID) | ORAL | Status: DC
  Administered 2014-10-21 – 2014-10-25 (×10): 2 mg via ORAL
  Filled 2014-10-20 (×10): qty 2

## 2014-10-20 MED ORDER — SODIUM CHLORIDE 0.9 % IV SOLN
500.0000 mg | Freq: Two times a day (BID) | INTRAVENOUS | Status: DC
  Administered 2014-10-21 (×2): 500 mg via INTRAVENOUS
  Filled 2014-10-20 (×2): qty 10

## 2014-10-20 MED ORDER — ONDANSETRON HCL 4 MG/2ML IJ SOLN
4.0000 mg | Freq: Three times a day (TID) | INTRAMUSCULAR | Status: DC
  Filled 2014-10-20: qty 2

## 2014-10-20 MED ORDER — DICYCLOMINE HCL 20 MG PO TABS
20.0000 mg | ORAL_TABLET | Freq: Three times a day (TID) | ORAL | Status: DC | PRN
  Administered 2014-10-21 – 2014-10-22 (×2): 20 mg via ORAL
  Filled 2014-10-20 (×3): qty 1

## 2014-10-20 MED ORDER — CYCLOBENZAPRINE HCL 5 MG PO TABS
5.0000 mg | ORAL_TABLET | Freq: Three times a day (TID) | ORAL | Status: DC | PRN
Start: 2014-10-20 — End: 2014-10-25
  Administered 2014-10-21 – 2014-10-22 (×2): 5 mg via ORAL
  Filled 2014-10-20 (×3): qty 1

## 2014-10-20 MED ORDER — HYDROMORPHONE HCL 1 MG/ML IJ SOLN
1.0000 mg | Freq: Once | INTRAMUSCULAR | Status: AC
  Administered 2014-10-20: 1 mg via INTRAVENOUS
  Filled 2014-10-20: qty 1

## 2014-10-20 MED ORDER — METOCLOPRAMIDE HCL 10 MG PO TABS
10.0000 mg | ORAL_TABLET | Freq: Three times a day (TID) | ORAL | Status: DC
  Administered 2014-10-21 (×2): 10 mg via ORAL
  Filled 2014-10-20 (×5): qty 1

## 2014-10-20 MED ORDER — HEPARIN SODIUM (PORCINE) 5000 UNIT/ML IJ SOLN
5000.0000 [IU] | Freq: Three times a day (TID) | INTRAMUSCULAR | Status: DC
  Administered 2014-10-21 – 2014-10-25 (×12): 5000 [IU] via SUBCUTANEOUS
  Filled 2014-10-20 (×16): qty 1

## 2014-10-20 MED ORDER — HYDROMORPHONE HCL 1 MG/ML IJ SOLN: 1.0000 mg | Freq: Once | INTRAMUSCULAR | Status: DC

## 2014-10-20 MED ORDER — ZOLPIDEM TARTRATE 10 MG PO TABS
10.0000 mg | ORAL_TABLET | Freq: Every evening | ORAL | Status: DC | PRN
  Administered 2014-10-21 – 2014-10-24 (×4): 10 mg via ORAL
  Filled 2014-10-20 (×3): qty 1

## 2014-10-20 MED ORDER — SODIUM CHLORIDE 0.9 % IV SOLN
1000.0000 mL | Freq: Once | INTRAVENOUS | Status: AC
  Administered 2014-10-21: 1000 mL via INTRAVENOUS

## 2014-10-20 MED ORDER — PANTOPRAZOLE SODIUM 40 MG IV SOLR
40.0000 mg | Freq: Every day | INTRAVENOUS | Status: DC
  Administered 2014-10-21 – 2014-10-22 (×3): 40 mg via INTRAVENOUS
  Filled 2014-10-20 (×4): qty 40

## 2014-10-20 MED ORDER — OXYCODONE HCL 5 MG PO TABS
15.0000 mg | ORAL_TABLET | ORAL | Status: DC | PRN
  Administered 2014-10-21 – 2014-10-23 (×10): 15 mg via ORAL
  Filled 2014-10-20 (×10): qty 3

## 2014-10-20 MED ORDER — RISAQUAD PO CAPS
1.0000 | ORAL_CAPSULE | Freq: Every day | ORAL | Status: DC
  Administered 2014-10-21 – 2014-10-25 (×5): 1 via ORAL
  Filled 2014-10-20 (×5): qty 1

## 2014-10-20 MED ORDER — MONTELUKAST SODIUM 10 MG PO TABS
10.0000 mg | ORAL_TABLET | Freq: Every day | ORAL | Status: DC
  Administered 2014-10-21 – 2014-10-24 (×4): 10 mg via ORAL
  Filled 2014-10-20 (×5): qty 1

## 2014-10-20 MED ORDER — HYDROMORPHONE HCL 1 MG/ML IJ SOLN
1.0000 mg | INTRAMUSCULAR | Status: AC | PRN
  Administered 2014-10-21 (×4): 1 mg via INTRAVENOUS
  Filled 2014-10-20 (×4): qty 1

## 2014-10-20 MED ORDER — SODIUM CHLORIDE 0.9 % IV BOLUS (SEPSIS)
1000.0000 mL | INTRAVENOUS | Status: AC
  Administered 2014-10-20: 1000 mL via INTRAVENOUS

## 2014-10-20 MED ORDER — SUMATRIPTAN SUCCINATE 50 MG PO TABS
50.0000 mg | ORAL_TABLET | ORAL | Status: DC | PRN
  Administered 2014-10-22: 50 mg via ORAL
  Filled 2014-10-20 (×3): qty 1

## 2014-10-20 MED ORDER — SODIUM CHLORIDE 0.9 % IV SOLN
INTRAVENOUS | Status: DC
  Administered 2014-10-21: 01:00:00 via INTRAVENOUS

## 2014-10-20 NOTE — H&P (Addendum)
[facility] History and Physical  Shane Curry ZOX:[id number] DOB: 11-29-1975 DOA: 10/20/2014  Referring physician: ED physician PCP: Tally Due, MD  Specialists:   Chief Complaint: Abdominal pain  HPI: Shane Curry is a 38 y.o. male with past medical history of Torette syndrome, migraine headache, gastroparesis, who presents with abdominal pain and nausea vomiting.  Patient has history of gastroparesis with recurrent abdominal pain. He has been admitted several times in the last few months and has had negative colonoscopy and endoscopy.  He had several CT scan of the abdomen, one of CT-abd showed possible colitis on 08/09/14, however following colonoscopy and EGD were negative, including biopsy. He always have some level of abdominal pain chronically. Patient reports that in the past 4 days, his abdominal pain has been progressively getting worse. It is associated with nausea, vomiting, but no diarrhea. He has chills, but no fever. Patient has chronic bilateral knee pain, denies fever, fatigue, headaches, cough, chest pain, SOB,  diarrhea, dysuria, urgency, frequency, hematuria, skin rashes or leg swelling.  Work up in the ED demonstrates negative urinalysis, lipase 53, AST 113, ALT 38, normal bilirubin, no leukocytosis. Patient is admitted to inpatient for further evaluation and treatment.  Review of Systems: As presented in the history of presenting illness, rest negative.  Where does patient live?  At home Can patient participate in ADLs? Yes  Allergy:  Allergies  Allergen Reactions  . Penicillins     Childhood reaction     Past Medical History  Diagnosis Date  . Anxiety   . Allergy   . Arthritis   . Migraine headache   . Tourettes disease   . Gastroparesis     Past Surgical History  Procedure Laterality Date  . Knee arthroscopy Left 11/2009  . Colonoscopy N/A 08/11/2014    Procedure: COLONOSCOPY;  Surgeon: Meryl Dare, MD;  Location: [facility]  ENDOSCOPY;  Service: Endoscopy;  Laterality: N/A;    Social History:  reports that he quit smoking about 5 years ago. His smoking use included Cigarettes. He smoked 0.00 packs per day. He has never used smokeless tobacco. He reports that he does not drink alcohol or use illicit drugs.  Family History:  Family History  Problem Relation Age of Onset  . Diabetes Father   . Diabetes Paternal Grandfather   . Diabetes Maternal Grandfather   . Colon cancer Neg Hx   . Esophageal cancer Neg Hx   . Stomach cancer Neg Hx   . Rectal cancer Neg Hx      Prior to Admission medications   Medication Sig Start Date End Date Taking? Authorizing Provider  alprazolam Prudy Feeler) 2 MG tablet Take 1-2 mg by mouth 2 (two) times daily.    Yes Historical Provider, MD  dicyclomine (BENTYL) 20 MG tablet Take 1 tablet (20 mg total) by mouth 3 (three) times daily as needed for spasms. 10/06/14  Yes [name], MD  metoCLOPramide (REGLAN) 10 MG tablet Take 1 tablet (10 mg total) by mouth 4 (four) times daily -  before meals and at bedtime. 09/01/14  Yes Beverley Fiedler, MD  montelukast (SINGULAIR) 10 MG tablet Take 1 tablet (10 mg total) by mouth at bedtime. 10/06/14  Yes [name], MD  omeprazole (PRILOSEC) 20 MG capsule Take 2 capsules (40 mg total) by mouth 2 (two) times daily before a meal. 10/06/14  Yes [name], MD  Probiotic Product (PROBIOTIC PO) Take 1 tablet by mouth daily.   Yes Historical Provider, MD  rizatriptan (MAXALT) 10 MG tablet Take 1 tablet (10 mg total) by mouth as needed for migraine (migraine). May repeat in 2 hours if needed 10/06/14  Yes Calvert CantorSaima Rizwan, MD  zolpidem (AMBIEN) 10 MG tablet Take 1 tablet (10 mg total) by mouth at bedtime as needed for sleep. 10/06/14 11/05/14 Yes Calvert CantorSaima Rizwan, MD  Alum & Mag Hydroxide-Simeth (GI COCKTAIL) SUSP suspension Take 30 mLs by mouth 3 (three) times daily as needed for indigestion. Shake well. Patient not taking: Reported on 10/20/2014 10/06/14   Calvert CantorSaima Rizwan, MD   clarithromycin (BIAXIN) 500 MG tablet Take 1 tablet (500 mg total) by mouth 2 (two) times daily. Patient not taking: Reported on 10/20/2014 10/06/14   Calvert CantorSaima Rizwan, MD  cyclobenzaprine (FLEXERIL) 5 MG tablet Take 1 tablet (5 mg total) by mouth 3 (three) times daily as needed for muscle spasms. Patient not taking: Reported on 10/20/2014 10/06/14   Calvert CantorSaima Rizwan, MD  feeding supplement, ENSURE COMPLETE, (ENSURE COMPLETE) LIQD Take 237 mLs by mouth 3 (three) times daily between meals. 10/06/14   Calvert CantorSaima Rizwan, MD  oxyCODONE (ROXICODONE) 15 MG immediate release tablet Take 1 tablet (15 mg total) by mouth every 4 (four) hours as needed for pain. Patient not taking: Reported on 10/20/2014 10/06/14   Calvert CantorSaima Rizwan, MD    Physical Exam: Filed Vitals:   10/20/14 1903 10/20/14 2125 10/20/14 2345  BP: 136/92 118/86 118/82  Pulse: 99 50 57  Temp: 97.6 F (36.4 C)  98.3 F (36.8 C)  TempSrc: Oral  Oral  Resp: 16 16 18   SpO2: 100% 100% 97%   General: Not in acute distress HEENT:       Eyes: PERRL, EOMI, no scleral icterus       ENT: No discharge from the ears and nose, no pharynx injection, no tonsillar enlargement.        Neck: No JVD, no bruit, no mass felt. Cardiac: S1/S2, RRR, No murmurs, No gallops or rubs Pulm: Good air movement bilaterally. Clear to auscultation bilaterally. No rales, wheezing, rhonchi or rubs. Abd: Soft, nondistended, diffusely tender, no rebound pain, no organomegaly, BS present Ext: No edema bilaterally. 2+DP/PT pulse bilaterally Musculoskeletal: No joint deformities, erythema, or stiffness, ROM full Skin: No rashes.  Neuro: Alert and oriented X3, cranial nerves II-XII grossly intact, muscle strength 5/5 in all extremeties, sensation to light touch intact.  Psych: Patient is not psychotic, no suicidal or hemocidal ideation.  Labs on Admission:  Basic Metabolic Panel:  Recent Labs Lab 10/20/14 1928  NA 140  K 4.1  CL 97  CO2 27  GLUCOSE 114*  BUN 17  CREATININE  0.81  CALCIUM 10.6*   Liver Function Tests:  Recent Labs Lab 10/20/14 1928  AST 36  ALT 113*  ALKPHOS 77  BILITOT 0.8  PROT 8.5*  ALBUMIN 4.8    Recent Labs Lab 10/20/14 1928  LIPASE 53   No results for input(s): AMMONIA in the last 168 hours. CBC:  Recent Labs Lab 10/20/14 1928  WBC 9.4  NEUTROABS 6.6  HGB 15.3  HCT 46.4  MCV 86.2  PLT 280   Cardiac Enzymes: No results for input(s): CKTOTAL, CKMB, CKMBINDEX, TROPONINI in the last 168 hours.  BNP (last 3 results) No results for input(s): PROBNP in the last 8760 hours. CBG: No results for input(s): GLUCAP in the last 168 hours.  Radiological Exams on Admission: No results found.  EKG: T-wave inversion in inferior leads and V2 to V3  Assessment/Plan Principal Problem:   Abdominal pain  Active Problems:   Tourette's syndrome   Generalized anxiety disorder   Protein-calorie malnutrition, severe   Gastroparesis   Chronic pain syndrome   Migraine headache   Elevated transaminase level   Abnormal EKG  Abdominal pain: Patient was diagnosed as gastroparesis which causes chronic abdominal pain. Lipase is negative. He had colonoscopy on 08/11/14 and EGD on 09/01/14 which are all negative. His abdominal pain is most likely due to gastroparesis. The etiology is not clear. Patient does not have history of diabetes. Patient does not have diarrhea, making infectious etiology unlikely.  -Admitted to MedSurg bed of observation -Pain control -IV erythromycin  -check A1c to rule out diabetes -Zofran for nausea -Continue home Reglan, Bentyl -Protonix - IV normal saline 100 mL per hour  Elevated transaminases: AST 113, ALT 36, patient denies drinking alcohol. It is most likely due to vomiting. -Check hepatitis panel  Tourette's syndrome:  -Continue home medications: Xanax  Migraine headache: -Sumatriptan when necessary  Abnormal EKG:T-wave inversion in inferior leads and V2 to V3. QTc interval is slightly  prolonged. Patient does not have chest pain.  -switch Zofran hydroxyzine for nausea -trop x 3 -repeat EKG in AM   DVT ppx: SQ Heparin     Code Status: Full code Family Communication: None at bed side.     Disposition Plan: Admit to inpatient   Date of Service 10/21/2014    Lorretta HarpIU, Tel Hevia [facility] Pager [phone]  If 7PM-7AM, please contact night-coverage [email or web address] Password [email or web address] 10/21/2014, 5:18 AM

## 2014-10-20 NOTE — ED Notes (Signed)
Pt reports severe abd pain with n/v since Monday.  With hx of gastroparesis, states he was seen for same Saturday after Thanksgiving and was admitted for 6 days.  Pt reports he has not been able to eat or drink.  States pain is so severe that he is not able to get out of bed or eat at all.

## 2014-10-20 NOTE — ED Notes (Signed)
Called floor to give report, secretary states they were unaware they were getting a pt and requested that they be given 10 minutes before I call again to give report

## 2014-10-20 NOTE — ED Provider Notes (Signed)
CSN: [id number]     Arrival date & time 10/20/14  1752 History   First MD Initiated Contact with Patient 10/20/14 2014     Chief Complaint  Patient presents with  . Abdominal Pain     (Consider location/radiation/quality/duration/timing/severity/associated sxs/prior Treatment) Patient is a 38 y.o. male presenting with abdominal pain. The history is provided by the patient.  Abdominal Pain Pain location:  Periumbilical Pain quality: aching   Pain radiates to:  Does not radiate Pain severity:  Severe Onset quality:  Gradual Duration:  4 days Timing:  Constant Progression:  Worsening Chronicity:  Chronic Context comment:  Gastroparesis Relieved by:  Nothing Worsened by:  Eating Ineffective treatments: oxycodone. Associated symptoms: nausea and vomiting   Associated symptoms: no chest pain, no cough, no diarrhea, no dysuria, no fever, no hematuria and no shortness of breath     Past Medical History  Diagnosis Date  . Anxiety   . Allergy   . Arthritis   . Migraine headache   . Tourettes disease   . Gastroparesis    Past Surgical History  Procedure Laterality Date  . Knee arthroscopy Left 11/2009  . Colonoscopy N/A 08/11/2014    Procedure: COLONOSCOPY;  Surgeon: Meryl DareMalcolm T Stark, MD;  Location: [facility] ENDOSCOPY;  Service: Endoscopy;  Laterality: N/A;   Family History  Problem Relation Age of Onset  . Diabetes Father   . Diabetes Paternal Grandfather   . Diabetes Maternal Grandfather   . Colon cancer Neg Hx   . Esophageal cancer Neg Hx   . Stomach cancer Neg Hx   . Rectal cancer Neg Hx    History  Substance Use Topics  . Smoking status: Former Smoker    Types: Cigarettes    Quit date: 04/10/2009  . Smokeless tobacco: Never Used  . Alcohol Use: No    Review of Systems  Constitutional: Negative for fever.  HENT: Negative for drooling and rhinorrhea.   Eyes: Negative for pain.  Respiratory: Negative for cough and shortness of breath.   Cardiovascular: Negative for  chest pain and leg swelling.  Gastrointestinal: Positive for nausea, vomiting and abdominal pain. Negative for diarrhea.  Genitourinary: Negative for dysuria and hematuria.  Musculoskeletal: Negative for gait problem and neck pain.  Skin: Negative for color change.  Neurological: Negative for numbness and headaches.  Hematological: Negative for adenopathy.  Psychiatric/Behavioral: Negative for behavioral problems.  All other systems reviewed and are negative.     Allergies  Penicillins  Home Medications   Prior to Admission medications   Medication Sig Start Date End Date Taking? Authorizing Provider  alprazolam Prudy Feeler(XANAX) 2 MG tablet Take 1-2 mg by mouth 2 (two) times daily.     Historical Provider, MD  Alum & Mag Hydroxide-Simeth (GI COCKTAIL) SUSP suspension Take 30 mLs by mouth 3 (three) times daily as needed for indigestion. Shake well. 10/06/14   Calvert CantorSaima Rizwan, MD  clarithromycin (BIAXIN) 500 MG tablet Take 1 tablet (500 mg total) by mouth 2 (two) times daily. 10/06/14   Calvert CantorSaima Rizwan, MD  cyclobenzaprine (FLEXERIL) 5 MG tablet Take 1 tablet (5 mg total) by mouth 3 (three) times daily as needed for muscle spasms. 10/06/14   Calvert CantorSaima Rizwan, MD  dicyclomine (BENTYL) 20 MG tablet Take 1 tablet (20 mg total) by mouth 3 (three) times daily as needed for spasms. 10/06/14   Calvert CantorSaima Rizwan, MD  feeding supplement, ENSURE COMPLETE, (ENSURE COMPLETE) LIQD Take 237 mLs by mouth 3 (three) times daily between meals. 10/06/14   Calvert CantorSaima Rizwan,  MD  metoCLOPramide (REGLAN) 10 MG tablet Take 1 tablet (10 mg total) by mouth 4 (four) times daily -  before meals and at bedtime. 09/01/14   Beverley Fiedler, MD  montelukast (SINGULAIR) 10 MG tablet Take 1 tablet (10 mg total) by mouth at bedtime. 10/06/14   [name], MD  omeprazole (PRILOSEC) 20 MG capsule Take 2 capsules (40 mg total) by mouth 2 (two) times daily before a meal. 10/06/14   [name], MD  oxyCODONE (ROXICODONE) 15 MG immediate release tablet Take 1  tablet (15 mg total) by mouth every 4 (four) hours as needed for pain. 10/06/14   [name], MD  rizatriptan (MAXALT) 10 MG tablet Take 1 tablet (10 mg total) by mouth as needed for migraine (migraine). May repeat in 2 hours if needed 10/06/14   [name], MD  zolpidem (AMBIEN) 10 MG tablet Take 1 tablet (10 mg total) by mouth at bedtime as needed for sleep. 10/06/14 11/05/14  [name], MD   BP 136/92 mmHg  Pulse 99  Temp(Src) 97.6 F (36.4 C) (Oral)  Resp 16  SpO2 100% Physical Exam  Constitutional: He is oriented to person, place, and time. He appears well-developed and well-nourished.  HENT:  Head: Normocephalic and atraumatic.  Right Ear: External ear normal.  Left Ear: External ear normal.  Nose: Nose normal.  Mouth/Throat: Oropharynx is clear and moist. No oropharyngeal exudate.  Eyes: Conjunctivae and EOM are normal. Pupils are equal, round, and reactive to light.  Neck: Normal range of motion. Neck supple.  Cardiovascular: Normal rate, regular rhythm, normal heart sounds and intact distal pulses.  Exam reveals no gallop and no friction rub.   No murmur heard. Pulmonary/Chest: Effort normal and breath sounds normal. No respiratory distress. He has no wheezes.  Abdominal: Soft. Bowel sounds are normal. He exhibits no distension. There is tenderness (mild to mod ttp of periumbilical area). There is no rebound and no guarding.  Musculoskeletal: Normal range of motion. He exhibits no edema or tenderness.  Neurological: He is alert and oriented to person, place, and time.  Skin: Skin is warm and dry.  Psychiatric: He has a normal mood and affect. His behavior is normal.  Nursing note and vitals reviewed.   ED Course  Procedures (including critical care time) Labs Review Labs Reviewed  COMPREHENSIVE METABOLIC PANEL - Abnormal; Notable for the following:    Glucose, Bld 114 (*)    Calcium 10.6 (*)    Total Protein 8.5 (*)    ALT 113 (*)    Anion gap 16 (*)    All other  components within normal limits  URINALYSIS, ROUTINE W REFLEX MICROSCOPIC - Abnormal; Notable for the following:    Color, Urine AMBER (*)    APPearance CLOUDY (*)    Ketones, ur 15 (*)    Protein, ur 30 (*)    All other components within normal limits  URINE MICROSCOPIC-ADD ON - Abnormal; Notable for the following:    Bacteria, UA FEW (*)    Casts HYALINE CASTS (*)    All other components within normal limits  CBC WITH DIFFERENTIAL  LIPASE, BLOOD  HEMOGLOBIN A1C  HEPATITIS PANEL, ACUTE  COMPREHENSIVE METABOLIC PANEL  CBC  PROTIME-INR    Imaging Review No results found.   EKG Interpretation None      MDM   Final diagnoses:  Periumbilical abdominal pain    8:43 PM 38 y.o. male with a history of gastroparesis presents with abdominal pain consistent with  previous episodes of gastroparesis. He has been admitted several times in the last few months and has had colonoscopy, endoscopy, and CT scan of the abdomen. He has followed up with gastroenterology. He presents today with complaint of worsening periumbilical abdominal pain over the last 4 days. He has not been able to tolerate solid intake due to pain. He has had one episode of emesis yesterday. He denies any fevers or diarrhea. He is afebrile and vital signs are unremarkable here. Screening labs are unremarkable. Will get pain control  and IV fluids.  Plan for admission for symptomatic treatment d/t ongoing pain.     Purvis SheffieldForrest Hassel Uphoff, MD 10/21/14 [phone]

## 2014-10-21 DIAGNOSIS — R9431 Abnormal electrocardiogram [ECG] [EKG]: Secondary | ICD-10-CM | POA: Diagnosis present

## 2014-10-21 LAB — TROPONIN I: Troponin I: <0.3 ng/mL (ref <0.30)

## 2014-10-21 LAB — GLUCOSE, CAPILLARY: Glucose-Capillary: 83 mg/dL (ref 70–99)

## 2014-10-21 LAB — COMPREHENSIVE METABOLIC PANEL
ALBUMIN: 3.6 g/dL (ref 3.5–5.2)
ALT: 75 U/L — ABNORMAL HIGH (ref 0–53)
AST: 22 U/L (ref 0–37)
Alkaline Phosphatase: 56 U/L (ref 39–117)
Anion gap: 10 (ref 5–15)
BUN: 17 mg/dL (ref 6–23)
CALCIUM: 9.2 mg/dL (ref 8.4–10.5)
CO2: 28 meq/L (ref 19–32)
CREATININE: 0.88 mg/dL (ref 0.50–1.35)
Chloride: 100 mEq/L (ref 96–112)
GFR calc Af Amer: >90 mL/min (ref >90)
GFR calc non Af Amer: >90 mL/min (ref >90)
Glucose, Bld: 106 mg/dL — ABNORMAL HIGH (ref 70–99)
Potassium: 3.4 mEq/L — ABNORMAL LOW (ref 3.7–5.3)
Sodium: 138 mEq/L (ref 137–147)
TOTAL PROTEIN: 6.4 g/dL (ref 6.0–8.3)
Total Bilirubin: 0.8 mg/dL (ref 0.3–1.2)

## 2014-10-21 LAB — HEPATITIS PANEL, ACUTE
HCV AB: NEGATIVE
HEP A IGM: NONREACTIVE
HEP B C IGM: NONREACTIVE
HEP B S AG: NEGATIVE

## 2014-10-21 LAB — CBC
HEMATOCRIT: 39.1 % (ref 39.0–52.0)
Hemoglobin: 12.6 g/dL — ABNORMAL LOW (ref 13.0–17.0)
MCH: 28.4 pg (ref 26.0–34.0)
MCHC: 32.2 g/dL (ref 30.0–36.0)
MCV: 88.1 fL (ref 78.0–100.0)
PLATELETS: 216 10*3/uL (ref 150–400)
RBC: 4.44 MIL/uL (ref 4.22–5.81)
RDW: 12.8 % (ref 11.5–15.5)
WBC: 8 10*3/uL (ref 4.0–10.5)

## 2014-10-21 LAB — PROTIME-INR
INR: 1.21 (ref 0.00–1.49)
Prothrombin Time: 15.5 seconds — ABNORMAL HIGH (ref 11.6–15.2)

## 2014-10-21 LAB — HEMOGLOBIN A1C
Hgb A1c MFr Bld: 5.3 % (ref <5.7)
MEAN PLASMA GLUCOSE: 105 mg/dL (ref <117)

## 2014-10-21 MED ORDER — SODIUM CHLORIDE 0.9 % IV SOLN
INTRAVENOUS | Status: DC
  Administered 2014-10-22 – 2014-10-23 (×3): via INTRAVENOUS

## 2014-10-21 MED ORDER — POTASSIUM CHLORIDE CRYS ER 20 MEQ PO TBCR
40.0000 meq | EXTENDED_RELEASE_TABLET | Freq: Once | ORAL | Status: AC
  Administered 2014-10-21: 40 meq via ORAL
  Filled 2014-10-21: qty 2

## 2014-10-21 MED ORDER — HYDROXYZINE HCL 25 MG PO TABS
25.0000 mg | ORAL_TABLET | Freq: Three times a day (TID) | ORAL | Status: DC | PRN
  Administered 2014-10-21: 25 mg via ORAL
  Filled 2014-10-21 (×3): qty 1

## 2014-10-21 MED ORDER — HYDROXYZINE HCL 50 MG/ML IM SOLN
50.0000 mg | Freq: Four times a day (QID) | INTRAMUSCULAR | Status: DC | PRN
  Filled 2014-10-21: qty 1

## 2014-10-21 MED ORDER — METOCLOPRAMIDE HCL 5 MG/ML IJ SOLN
10.0000 mg | Freq: Four times a day (QID) | INTRAMUSCULAR | Status: DC
  Administered 2014-10-21 – 2014-10-25 (×16): 10 mg via INTRAVENOUS
  Filled 2014-10-21 (×23): qty 2

## 2014-10-21 MED ORDER — SODIUM CHLORIDE 0.9 % IV BOLUS (SEPSIS)
250.0000 mL | Freq: Once | INTRAVENOUS | Status: AC
Start: 2014-10-21 — End: 2014-10-22
  Administered 2014-10-21: 250 mL via INTRAVENOUS

## 2014-10-21 MED ORDER — HYDROXYZINE HCL 25 MG PO TABS
25.0000 mg | ORAL_TABLET | ORAL | Status: AC
  Administered 2014-10-21: 25 mg via ORAL

## 2014-10-21 MED ORDER — KETOROLAC TROMETHAMINE 30 MG/ML IJ SOLN
30.0000 mg | Freq: Four times a day (QID) | INTRAMUSCULAR | Status: DC | PRN
  Administered 2014-10-21 – 2014-10-23 (×3): 30 mg via INTRAVENOUS
  Filled 2014-10-21 (×4): qty 1

## 2014-10-21 MED ORDER — HYDROXYZINE HCL 25 MG PO TABS
25.0000 mg | ORAL_TABLET | Freq: Three times a day (TID) | ORAL | Status: DC | PRN
  Filled 2014-10-21: qty 2

## 2014-10-21 NOTE — Progress Notes (Signed)
[facility] PROGRESS NOTE  Shane Curry MVH:[id number] DOB: [birth year] DOA: 10/20/2014 PCP: Tally DueGUEST, [name], MD  Assessment/Plan: #1 abdominal pain/gastroparesis Patient's abdominal pain is likely secondary to gastroparesis. Patient has had extensive workup for this. Lipase is negative. Patient had a colonoscopy 08/11/2014 an EGD 09/01/2014 which were unremarkable. Patient states had a gastric emptying study which was consistent with gastroparesis. Patient stated that has run out of most of his medications and in particular run out of his Reglan 4 days prior to admission. Patient denies any diarrhea. Will discontinue IV erythromycin. Change oral Reglan to IV Reglan. Continue Hydroxacen for nausea as patient has QTc prolongation. Continue Bentyl and PPI. Continue IV fluids. Supportive care. They management.  #2 transaminitis Patient denies any alcohol abuse. May be secondary to dehydration. Acute hepatitis panel negative.  #3 abnormal EKG Patient noted to have T-wave inversion in the inferior leads and V2 through V3 and slight QTc prolongation. Patient denies any active chest pain. No old EKG to compare to. Continue to cycle cardiac enzymes. Check a 2-D echo. Follow.  #4 migraine headaches Sumatriptan when necessary.  #5 Tourette's syndrome Continue home regimen Xanax.  #6 prophylaxis Heparin for DVT prophylaxis.  Code Status: Full Family Communication: Updated patient. No family at bedside. Disposition Plan: Home when medically stable.   Consultants:  None  Procedures:  None  Antibiotics:  None  HPI/Subjective: Patient with complaints of nausea and abdominal pain. Patient denies any chest pain. No shortness of breath.  Objective: Filed Vitals:   10/21/14 1443  BP: 100/65  Pulse: 79  Temp: 97.4 F (36.3 C)  Resp: 18    Intake/Output Summary (Last 24 hours) at 10/21/14 1744 Last data filed at 10/21/14 0700  Gross per 24 hour  Intake      0 ml   Output      0 ml  Net      0 ml   Filed Weights   10/21/14 0900  Weight: 58.1 kg (128 lb 1.4 oz)    Exam:   General:  NAD  Cardiovascular: RRR  Respiratory: CTAB  Abdomen: Soft, nondistended, tender to palpation in the mid abdominal region, positive bowel sounds.  Musculoskeletal: No clubbing cyanosis or edema.  Data Reviewed: Basic Metabolic Panel:  Recent Labs Lab 10/20/14 1928 10/21/14 0520  NA 140 138  K 4.1 3.4*  CL 97 100  CO2 27 28  GLUCOSE 114* 106*  BUN 17 17  CREATININE 0.81 0.88  CALCIUM 10.6* 9.2   Liver Function Tests:  Recent Labs Lab 10/20/14 1928 10/21/14 0520  AST 36 22  ALT 113* 75*  ALKPHOS 77 56  BILITOT 0.8 0.8  PROT 8.5* 6.4  ALBUMIN 4.8 3.6    Recent Labs Lab 10/20/14 1928  LIPASE 53   No results for input(s): AMMONIA in the last 168 hours. CBC:  Recent Labs Lab 10/20/14 1928 10/21/14 0520  WBC 9.4 8.0  NEUTROABS 6.6  --   HGB 15.3 12.6*  HCT 46.4 39.1  MCV 86.2 88.1  PLT 280 216   Cardiac Enzymes:  Recent Labs Lab 10/21/14 0520 10/21/14 1136  TROPONINI <0.30 <0.30   BNP (last 3 results) No results for input(s): PROBNP in the last 8760 hours. CBG:  Recent Labs Lab 10/21/14 0757  GLUCAP 83    No results found for this or any previous visit (from the past 240 hour(s)).   Studies: No results found.  Scheduled Meds: . acidophilus  1 capsule Oral Daily  . alprazolam  1-2 mg Oral BID  . feeding supplement (ENSURE COMPLETE)  237 mL Oral TID BM  . heparin  5,000 Units Subcutaneous 3 times per day  . metoCLOPramide (REGLAN) injection  10 mg Intravenous 4 times per day  . montelukast  10 mg Oral QHS  . pantoprazole (PROTONIX) IV  40 mg Intravenous QHS   Continuous Infusions: . sodium chloride 125 mL/hr at 10/21/14 [id number]    Principal Problem:   Abdominal pain Active Problems:   Tourette's syndrome   Generalized anxiety disorder   Protein-calorie malnutrition, severe   Gastroparesis   Chronic  pain syndrome   Migraine headache   Elevated transaminase level   Abnormal EKG    Time spent: 45 minutes    THOMPSON,DANIEL M.D. [facility] Pager [phone]. If 7PM-7AM, please contact night-coverage at [email or web address], password [email or web address] 10/21/2014, 5:44 PM  LOS: 1 day

## 2014-10-21 NOTE — Progress Notes (Signed)
Pt arrived to unit room 1507 via stretcher. VS taken pt oriented to room  And callbell with no complications. Gait unsteady, general weakness pain 5/10. Initial assessment completed. Will continue to monitor throughout shift

## 2014-10-22 ENCOUNTER — Inpatient Hospital Stay (HOSPITAL_COMMUNITY): Payer: Medicaid Other

## 2014-10-22 DIAGNOSIS — I369 Nonrheumatic tricuspid valve disorder, unspecified: Secondary | ICD-10-CM

## 2014-10-22 DIAGNOSIS — R109 Unspecified abdominal pain: Secondary | ICD-10-CM

## 2014-10-22 LAB — BASIC METABOLIC PANEL
ANION GAP: 8 (ref 5–15)
BUN: 11 mg/dL (ref 6–23)
CALCIUM: 8.7 mg/dL (ref 8.4–10.5)
CHLORIDE: 105 meq/L (ref 96–112)
CO2: 28 mEq/L (ref 19–32)
CREATININE: 0.81 mg/dL (ref 0.50–1.35)
GFR calc Af Amer: >90 mL/min (ref >90)
GFR calc non Af Amer: >90 mL/min (ref >90)
Glucose, Bld: 84 mg/dL (ref 70–99)
Potassium: 4.3 mEq/L (ref 3.7–5.3)
Sodium: 141 mEq/L (ref 137–147)

## 2014-10-22 LAB — CBC
HCT: 37.7 % — ABNORMAL LOW (ref 39.0–52.0)
Hemoglobin: 12 g/dL — ABNORMAL LOW (ref 13.0–17.0)
MCH: 28.2 pg (ref 26.0–34.0)
MCHC: 31.8 g/dL (ref 30.0–36.0)
MCV: 88.7 fL (ref 78.0–100.0)
PLATELETS: 167 10*3/uL (ref 150–400)
RBC: 4.25 MIL/uL (ref 4.22–5.81)
RDW: 12.9 % (ref 11.5–15.5)
WBC: 5.1 10*3/uL (ref 4.0–10.5)

## 2014-10-22 LAB — GLUCOSE, CAPILLARY: GLUCOSE-CAPILLARY: 78 mg/dL (ref 70–99)

## 2014-10-22 MED ORDER — DOCUSATE SODIUM 100 MG PO CAPS
100.0000 mg | ORAL_CAPSULE | Freq: Two times a day (BID) | ORAL | Status: DC
  Administered 2014-10-22 – 2014-10-25 (×5): 100 mg via ORAL
  Filled 2014-10-22 (×8): qty 1

## 2014-10-22 MED ORDER — HYDROMORPHONE HCL 1 MG/ML IJ SOLN
1.0000 mg | Freq: Once | INTRAMUSCULAR | Status: AC
  Administered 2014-10-22: 1 mg via INTRAVENOUS
  Filled 2014-10-22: qty 1

## 2014-10-22 MED ORDER — HYDROMORPHONE HCL 1 MG/ML IJ SOLN
1.0000 mg | INTRAMUSCULAR | Status: DC | PRN
  Administered 2014-10-22 – 2014-10-23 (×6): 1 mg via INTRAVENOUS
  Filled 2014-10-22 (×6): qty 1

## 2014-10-22 NOTE — Progress Notes (Signed)
  Echocardiogram 2D Echocardiogram has been performed.  Janalyn HarderWest, Azael Ragain R 10/22/2014, 10:28 AM

## 2014-10-22 NOTE — Consult Note (Signed)
Consult Note for Shane Curry  Reason for Consult: Nausea/Vomiting and abdominal pain.  History of gastroparesis Referring Physician: [name]  Alesia Morinoss M Riddle HPI: This is a 38 year old male with a presumed diagnosis of gastroparesis, chronic abdominal pain, chronic left knee pain, and Tourette's syndrome admitted with nausea, vomiting, and abdominal pain.  The patient was recently discharged from the hospital with the same symptoms. He was treated with IV nacrotics in the hospital and PO narcotics at home.  On 08/2014 his EGD revealed a significant amount of retained gastric contents, which was suspicious for gastroparesis.  No formal studies performed, but he continues on narcotics and he cannot get off of the medication.  The patient was being evaluated by a Pain Clinic, but it was too expensive for him to afford.  He was taking 15 mg of oxycodone six times per day, which helped to control his abdominal and knee pain.  Starting last Tuesday his symptoms markedly worsened and he delayed his presentation to the hospital as he had obligations to his daughter.  Currently he feels much better with the pain medication and IV hydration.  Additionally, no chronic marijuana use.  Past Medical History  Diagnosis Date  . Anxiety   . Allergy   . Arthritis   . Migraine headache   . Tourettes disease   . Gastroparesis     Past Surgical History  Procedure Laterality Date  . Knee arthroscopy Left 11/2009  . Colonoscopy N/A 08/11/2014    Procedure: COLONOSCOPY;  Surgeon: Meryl DareMalcolm T Stark, MD;  Location: [facility];  Service: Endoscopy;  Laterality: N/A;    Family History  Problem Relation Age of Onset  . Diabetes Father   . Diabetes Paternal Grandfather   . Diabetes Maternal Grandfather   . Colon cancer Neg Hx   . Esophageal cancer Neg Hx   . Stomach cancer Neg Hx   . Rectal cancer Neg Hx     Social History:  reports that he quit smoking about 5 years ago. His smoking use included Cigarettes.  He smoked 0.00 packs per day. He has never used smokeless tobacco. He reports that he does not drink alcohol or use illicit drugs.  Allergies:  Allergies  Allergen Reactions  . Penicillins     Childhood reaction     Medications:  Scheduled: . acidophilus  1 capsule Oral Daily  . alprazolam  1-2 mg Oral BID  . docusate sodium  100 mg Oral BID  . feeding supplement (ENSURE COMPLETE)  237 mL Oral TID BM  . heparin  5,000 Units Subcutaneous 3 times per day  . metoCLOPramide (REGLAN) injection  10 mg Intravenous 4 times per day  . montelukast  10 mg Oral QHS  . pantoprazole (PROTONIX) IV  40 mg Intravenous QHS   Continuous: . sodium chloride 100 mL/hr at 10/22/14 1502    Results for orders placed or performed during the hospital encounter of 10/20/14 (from the past 24 hour(s))  Troponin I (q 6hr x 3)     Status: None   Collection Time: 10/21/14  5:11 PM  Result Value Ref Range   Troponin I <0.30 <0.30 ng/mL  CBC     Status: Abnormal   Collection Time: 10/22/14  5:10 AM  Result Value Ref Range   WBC 5.1 4.0 - 10.5 K/uL   RBC 4.25 4.22 - 5.81 MIL/uL   Hemoglobin 12.0 (L) 13.0 - 17.0 g/dL   HCT 16.137.7 (L) 09.639.0 - 04.552.0 %   MCV  88.7 78.0 - 100.0 fL   MCH 28.2 26.0 - 34.0 pg   MCHC 31.8 30.0 - 36.0 g/dL   RDW 78.212.9 95.611.5 - 21.315.5 %   Platelets 167 150 - 400 K/uL  Basic metabolic panel     Status: None   Collection Time: 10/22/14  5:10 AM  Result Value Ref Range   Sodium 141 137 - 147 mEq/L   Potassium 4.3 3.7 - 5.3 mEq/L   Chloride 105 96 - 112 mEq/L   CO2 28 19 - 32 mEq/L   Glucose, Bld 84 70 - 99 mg/dL   BUN 11 6 - 23 mg/dL   Creatinine, Ser 0.860.81 0.50 - 1.35 mg/dL   Calcium 8.7 8.4 - 57.810.5 mg/dL   GFR calc non Af Amer >90 >90 mL/min   GFR calc Af Amer >90 >90 mL/min   Anion gap 8 5 - 15  Glucose, capillary     Status: None   Collection Time: 10/22/14  7:49 AM  Result Value Ref Range   Glucose-Capillary 78 70 - 99 mg/dL     Dg Abd 2 Views  46/96/295212/20/2015   CLINICAL DATA:   Abdominal pain, nausea, vomiting  EXAM: ABDOMEN - 2 VIEW  COMPARISON:  09/30/2014  FINDINGS: There is no bowel dilatation to suggest obstruction. There are no air-fluid levels. There is no evidence of pneumoperitoneum, portal venous gas, or pneumatosis. There are no pathologic calcifications along the expected course of the ureters.  The osseous structures are unremarkable.  IMPRESSION: Unremarkable abdominal series.  No bowel obstruction.   Electronically Signed   By: Elige KoHetal  Patel   On: 10/22/2014 11:07    ROS:  As stated above in the HPI otherwise negative.  Blood pressure 113/69, pulse 56, temperature 98.2 F (36.8 C), temperature source Oral, resp. rate 16, height 5\' 9"  (1.753 m), weight 58.1 kg (128 lb 1.4 oz), SpO2 98 %.    PE: Gen: NAD, Alert and Oriented, thin HEENT:  [place]/AT, EOMI Neck: Supple, no LAD Lungs: CTA Bilaterally CV: RRR without M/G/R ABM: Soft, tender in the mid to lower abdomen, +BS Ext: No C/C/E  Assessment/Plan: 1) Presumed gastroparesis. 2) Chronic abdominal pain on narcotics. 3) Chronic knee pain.   This is a difficult management case as he requires a high dosage of narcotic medications.  A possibility for treatment of his chronic abdominal pain can be Nucynta.  Plan: 1) Continue with the current pain regimen management. 2) IV hydration. 3) Metoclopramide. 4) [name] to assume care in the AM.    [name] 10/22/2014, 3:08 PM

## 2014-10-22 NOTE — Progress Notes (Signed)
[facility] PROGRESS NOTE  Shane MorinRoss M Curry KGM:[id number] DOB: 1976/10/14 DOA: 10/20/2014 PCP: Tally DueGUEST, [name], MD  Assessment/Plan: #1 abdominal pain/gastroparesis Patient's abdominal pain is likely secondary to gastroparesis. Patient still with significant abdominal pain. Patient has had extensive workup for this. Lipase is negative. Patient had a colonoscopy 08/11/2014 an EGD 09/01/2014 which were unremarkable. Patient states had a gastric emptying study which was consistent with gastroparesis. Patient stated that has run out of most of his medications and in particular run out of his Reglan 4 days prior to admission. Patient denies any diarrhea. Discontinued IV erythromycin. Continue IV Reglan. Continue Hydroxyzine for nausea as patient had QTc prolongation. Continue Bentyl and PPI. Continue IV fluids. Supportive care. Pain management. Will check a KUB. Consult with GI for further evaluation and management.  #2 transaminitis Patient denies any alcohol abuse. May be secondary to dehydration. Acute hepatitis panel negative. Follow  #3 abnormal EKG Patient noted to have T-wave inversion in the inferior leads and V2 through V3 and slight QTc prolongation. Patient denies any active chest pain. No old EKG to compare to. Cardiac enzymes negative 3. Repeat EKG with less pronounced T-wave inversions. 2-D echo pending.  Follow.  #4 migraine headaches Sumatriptan when necessary.  #5 Tourette's syndrome Continue home regimen Xanax.  #6 Chronic pain Continue pain regimen.Dilaudid prn  #7 prophylaxis Heparin for DVT prophylaxis.  Code Status: Full Family Communication: Updated patient. No family at bedside. Disposition Plan: Home when medically stable.   Consultants:  None  Procedures:  None  Antibiotics:  None  HPI/Subjective: Patient with complaints of nausea and abdominal pain. Patient denies any chest pain. No shortness of breath. Patient requesting dilated to  control his abdominal pain.  Objective: Filed Vitals:   10/22/14 0619  BP: 113/69  Pulse: 56  Temp: 98.2 F (36.8 C)  Resp: 16   No intake or output data in the 24 hours ending 10/22/14 0954 Filed Weights   10/21/14 0900  Weight: 58.1 kg (128 lb 1.4 oz)    Exam:   General:  NAD  Cardiovascular: RRR  Respiratory: CTAB  Abdomen: Soft, nondistended, tender to palpation in the mid abdominal region, positive bowel sounds.  Musculoskeletal: No clubbing cyanosis or edema.  Data Reviewed: Basic Metabolic Panel:  Recent Labs Lab 10/20/14 1928 10/21/14 0520 10/22/14 0510  NA 140 138 141  K 4.1 3.4* 4.3  CL 97 100 105  CO2 27 28 28   GLUCOSE 114* 106* 84  BUN 17 17 11   CREATININE 0.81 0.88 0.81  CALCIUM 10.6* 9.2 8.7   Liver Function Tests:  Recent Labs Lab 10/20/14 1928 10/21/14 0520  AST 36 22  ALT 113* 75*  ALKPHOS 77 56  BILITOT 0.8 0.8  PROT 8.5* 6.4  ALBUMIN 4.8 3.6    Recent Labs Lab 10/20/14 1928  LIPASE 53   No results for input(s): AMMONIA in the last 168 hours. CBC:  Recent Labs Lab 10/20/14 1928 10/21/14 0520 10/22/14 0510  WBC 9.4 8.0 5.1  NEUTROABS 6.6  --   --   HGB 15.3 12.6* 12.0*  HCT 46.4 39.1 37.7*  MCV 86.2 88.1 88.7  PLT 280 216 167   Cardiac Enzymes:  Recent Labs Lab 10/21/14 0520 10/21/14 1136 10/21/14 1711  TROPONINI <0.30 <0.30 <0.30   BNP (last 3 results) No results for input(s): PROBNP in the last 8760 hours. CBG:  Recent Labs Lab 10/21/14 0757 10/22/14 0749  GLUCAP 83 78    No results found for this or any  previous visit (from the past 240 hour(s)).   Studies: No results found.  Scheduled Meds: . acidophilus  1 capsule Oral Daily  . alprazolam  1-2 mg Oral BID  . docusate sodium  100 mg Oral BID  . feeding supplement (ENSURE COMPLETE)  237 mL Oral TID BM  . heparin  5,000 Units Subcutaneous 3 times per day  . metoCLOPramide (REGLAN) injection  10 mg Intravenous 4 times per day  .  montelukast  10 mg Oral QHS  . pantoprazole (PROTONIX) IV  40 mg Intravenous QHS   Continuous Infusions: . sodium chloride 125 mL/hr at 10/22/14 0230    Principal Problem:   Abdominal pain Active Problems:   Tourette's syndrome   Generalized anxiety disorder   Protein-calorie malnutrition, severe   Gastroparesis   Chronic pain syndrome   Migraine headache   Elevated transaminase level   Abnormal EKG    Time spent: 45 minutes    THOMPSON,DANIEL M.D. [facility] Pager [phone]. If 7PM-7AM, please contact night-coverage at [email or web address], password [email or web address] 10/22/2014, 9:54 AM  LOS: 2 days

## 2014-10-22 NOTE — Progress Notes (Signed)
INITIAL NUTRITION ASSESSMENT  DOCUMENTATION CODES Per approved criteria  -Severe malnutrition in the context of chronic illness  Pt meets criteria for severe MALNUTRITION in the context of chronic illness as evidenced by severe fat and muscle depletion.   INTERVENTION: -Continue Ensure Complete po TID, each supplement provides 350 kcal and 13 grams of protein -Diet advancement per MD as medically appropriate. Patient eager to have solids. -Encourage PO intake -RD to continue to monitor  NUTRITION DIAGNOSIS: Inadequate oral intake related to abdominal pain as evidenced by clear liquid diet.   Goal: Pt to meet >/= 90% of their estimated nutrition needs   Monitor:  PO and supplemental intake, weight, labs, I/O's  Reason for Assessment: Pt identified as at nutrition risk on the Malnutrition Screen Tool  Admitting Dx: Abdominal pain  ASSESSMENT: 38 y.o. male with past medical history of Torette syndrome, migraine headache, gastroparesis, who presents with abdominal pain and nausea vomiting.He always have some level of abdominal pain chronically. Patient reports that in the past 4 days, his abdominal pain has been progressively getting worse. It is associated with nausea, vomiting, but no diarrhea.   Pt with 3 lb weight loss since October (insignificant for time frame).  Pt has not eaten anything solid for 6 days, since Friday he has had nothing but Ensure supplements. Pt states he is ready to have something solid and feels that he can tolerate at least toast.  Prior to his symptoms, pt reports good appetite and eating well.   Nutrition Focused Physical Exam:  Subcutaneous Fat:  Orbital Region: WNL Upper Arm Region: severe depletion Thoracic and Lumbar Region: NA  Muscle:  Temple Region: moderate depletion Clavicle Bone Region: severe depletion Clavicle and Acromion Bone Region: severe depletion Scapular Bone Region: WNL Dorsal Hand: WNL Patellar Region: WNL Anterior Thigh  Region: WNL Posterior Calf Region: WNL  Edema: no LE edema  Labs reviewed: WNL  Height: Ht Readings from Last 1 Encounters:  10/21/14 5\' 9"  (1.753 m)    Weight: Wt Readings from Last 1 Encounters:  10/21/14 128 lb 1.4 oz (58.1 kg)    Ideal Body Weight: 160 lb  % Ideal Body Weight: 80%  Wt Readings from Last 10 Encounters:  10/21/14 128 lb 1.4 oz (58.1 kg)  09/01/14 126 lb (57.153 kg)  08/29/14 126 lb 6 oz (57.323 kg)  08/11/14 131 lb 2 oz (59.478 kg)  08/10/14 131 lb 2 oz (59.478 kg)  11/18/12 157 lb (71.215 kg)  08/27/12 152 lb 3.2 oz (69.037 kg)  08/18/12 151 lb (68.493 kg)  08/13/12 151 lb 12.8 oz (68.856 kg)  08/06/12 151 lb (68.493 kg)    Usual Body Weight: 146 lb  % Usual Body Weight: 88%  BMI:  Body mass index is 18.91 kg/(m^2).  Estimated Nutritional Needs: Kcal: 1750-1950 Protein: 85-95g Fluid: 1.8L/day  Skin: intact  Diet Order: Diet clear liquid  EDUCATION NEEDS: -Education not appropriate at this time  No intake or output data in the 24 hours ending 10/22/14 1241  Last BM: 12/18  Labs:   Recent Labs Lab 10/20/14 1928 10/21/14 0520 10/22/14 0510  NA 140 138 141  K 4.1 3.4* 4.3  CL 97 100 105  CO2 27 28 28   BUN 17 17 11   CREATININE 0.81 0.88 0.81  CALCIUM 10.6* 9.2 8.7  GLUCOSE 114* 106* 84    CBG (last 3)   Recent Labs  10/21/14 0757 10/22/14 0749  GLUCAP 83 78    Scheduled Meds: . acidophilus  1  capsule Oral Daily  . alprazolam  1-2 mg Oral BID  . docusate sodium  100 mg Oral BID  . feeding supplement (ENSURE COMPLETE)  237 mL Oral TID BM  . heparin  5,000 Units Subcutaneous 3 times per day  . metoCLOPramide (REGLAN) injection  10 mg Intravenous 4 times per day  . montelukast  10 mg Oral QHS  . pantoprazole (PROTONIX) IV  40 mg Intravenous QHS    Continuous Infusions: . sodium chloride 100 mL/hr at 10/22/14 1115    Past Medical History  Diagnosis Date  . Anxiety   . Allergy   . Arthritis   . Migraine  headache   . Tourettes disease   . Gastroparesis     Past Surgical History  Procedure Laterality Date  . Knee arthroscopy Left 11/2009  . Colonoscopy N/A 08/11/2014    Procedure: COLONOSCOPY;  Surgeon: Meryl DareMalcolm T Stark, MD;  Location: [facility];  Service: Endoscopy;  Laterality: N/A;    Tilda FrancoLindsey Sundeep Cary, MS, RD, LDN Pager: [phone] After Hours Pager: [phone]

## 2014-10-23 DIAGNOSIS — G894 Chronic pain syndrome: Secondary | ICD-10-CM

## 2014-10-23 DIAGNOSIS — R1033 Periumbilical pain: Secondary | ICD-10-CM

## 2014-10-23 DIAGNOSIS — F411 Generalized anxiety disorder: Secondary | ICD-10-CM

## 2014-10-23 LAB — BASIC METABOLIC PANEL
Anion gap: 7 (ref 5–15)
BUN: 6 mg/dL (ref 6–23)
CO2: 30 meq/L (ref 19–32)
Calcium: 9.2 mg/dL (ref 8.4–10.5)
Chloride: 103 mEq/L (ref 96–112)
Creatinine, Ser: 0.88 mg/dL (ref 0.50–1.35)
GFR calc non Af Amer: >90 mL/min (ref >90)
Glucose, Bld: 99 mg/dL (ref 70–99)
POTASSIUM: 4.6 meq/L (ref 3.7–5.3)
SODIUM: 140 meq/L (ref 137–147)

## 2014-10-23 LAB — CBC
HEMATOCRIT: 38.4 % — AB (ref 39.0–52.0)
HEMOGLOBIN: 12.5 g/dL — AB (ref 13.0–17.0)
MCH: 28.5 pg (ref 26.0–34.0)
MCHC: 32.6 g/dL (ref 30.0–36.0)
MCV: 87.7 fL (ref 78.0–100.0)
Platelets: 213 10*3/uL (ref 150–400)
RBC: 4.38 MIL/uL (ref 4.22–5.81)
RDW: 12.8 % (ref 11.5–15.5)
WBC: 5.2 10*3/uL (ref 4.0–10.5)

## 2014-10-23 LAB — GLUCOSE, CAPILLARY: GLUCOSE-CAPILLARY: 83 mg/dL (ref 70–99)

## 2014-10-23 LAB — SEDIMENTATION RATE: Sed Rate: 5 mm/hr (ref 0–16)

## 2014-10-23 MED ORDER — PREGABALIN 25 MG PO CAPS
25.0000 mg | ORAL_CAPSULE | Freq: Every day | ORAL | Status: DC
  Administered 2014-10-23 – 2014-10-25 (×3): 25 mg via ORAL
  Filled 2014-10-23 (×3): qty 1

## 2014-10-23 MED ORDER — PANTOPRAZOLE SODIUM 40 MG PO TBEC
40.0000 mg | DELAYED_RELEASE_TABLET | Freq: Every day | ORAL | Status: DC
  Administered 2014-10-23 – 2014-10-25 (×3): 40 mg via ORAL
  Filled 2014-10-23 (×4): qty 1

## 2014-10-23 NOTE — Progress Notes (Signed)
[facility] PROGRESS NOTE  Shane Curry WUJ:[id number] DOB: [birth year] DOA: 10/20/2014 PCP: Tally DueGUEST, [name], MD  Assessment/Plan: #1 abdominal pain/gastroparesis Patient's abdominal pain is likely secondary to presumed gastroparesis. Patient still with significant abdominal pain. Patient has had extensive workup for this. Lipase is negative. Patient had a colonoscopy 08/11/2014 an EGD 09/01/2014 which were unremarkable. Patient states had a gastric emptying study which was consistent with gastroparesis. Patient stated that has run out of most of his medications and in particular run out of his Reglan 4 days prior to admission. Patient denies any diarrhea. Discontinued IV erythromycin. Continue IV Reglan. Continue Hydroxyzine for nausea as patient had QTc prolongation. Continue Bentyl and PPI. Continue IV fluids. Supportive care. Pain management. KUB unremarkable. Will advance diet to a regular diet. GI has seen the patient and following.   #2 transaminitis Patient denies any alcohol abuse. May be secondary to dehydration. Acute hepatitis panel negative. Follow  #3 abnormal EKG Patient noted to have T-wave inversion in the inferior leads and V2 through V3 and slight QTc prolongation. Patient denies any active chest pain. No old EKG to compare to. Cardiac enzymes negative 3. Repeat EKG with less pronounced T-wave inversions. 2-D echo with EF of 55-60% with no wall motion abnormalities mild right atrial enlargement, mild TR, RVSP 26 mmHg. Will likely need outpatient follow-up with PCP. Will curbside cardiology. Follow.  #4 migraine headaches Sumatriptan when necessary.  #5 Tourette's syndrome Continue home regimen Xanax.  #6 Chronic pain Continue pain regimen. Dilaudid prn  #7 prophylaxis Heparin for DVT prophylaxis.  Code Status: Full Family Communication: Updated patient. No family at bedside. Disposition Plan: Home when medically  stable.   Consultants:  Gastroenterology: Dr. Elnoria HowardHung 10/22/2014  Procedures:  2-D echo 10/22/2014---LVEF 55-60%, normal wall thickness, wall motion and normal diastolic function, mild RAE, mild TR, RVSP 26 mmHg.  Acute abdominal series 10/22/2014 ----Unremarkable abdominal series. No bowel obstruction.   Antibiotics:  None  HPI/Subjective: Patient denies nausea and emesis. Patient states abdominal pain on and off, still requiring narcotic pain meds, and per patient not at his baseline.. Patient denies any chest pain. No shortness of breath.  Objective: Filed Vitals:   10/23/14 0559  BP: 110/70  Pulse: 57  Temp: 98.1 F (36.7 C)  Resp: 18    Intake/Output Summary (Last 24 hours) at 10/23/14 [id number] Last data filed at 10/22/14 1818  Gross per 24 hour  Intake   3980 ml  Output      0 ml  Net   3980 ml   Filed Weights   10/21/14 0900  Weight: 58.1 kg (128 lb 1.4 oz)    Exam:   General:  NAD  Cardiovascular: RRR  Respiratory: CTAB  Abdomen: Soft, nondistended, less tender to palpation in the mid abdominal region, positive bowel sounds.  Musculoskeletal: No clubbing cyanosis or edema.  Data Reviewed: Basic Metabolic Panel:  Recent Labs Lab 10/20/14 1928 10/21/14 0520 10/22/14 0510 10/23/14 0450  NA 140 138 141 140  K 4.1 3.4* 4.3 4.6  CL 97 100 105 103  CO2 27 28 28 30   GLUCOSE 114* 106* 84 99  BUN 17 17 11 6   CREATININE 0.81 0.88 0.81 0.88  CALCIUM 10.6* 9.2 8.7 9.2   Liver Function Tests:  Recent Labs Lab 10/20/14 1928 10/21/14 0520  AST 36 22  ALT 113* 75*  ALKPHOS 77 56  BILITOT 0.8 0.8  PROT 8.5* 6.4  ALBUMIN 4.8 3.6    Recent Labs Lab 10/20/14 1928  LIPASE 53   No results for input(s): AMMONIA in the last 168 hours. CBC:  Recent Labs Lab 10/20/14 1928 10/21/14 0520 10/22/14 0510 10/23/14 0450  WBC 9.4 8.0 5.1 5.2  NEUTROABS 6.6  --   --   --   HGB 15.3 12.6* 12.0* 12.5*  HCT 46.4 39.1 37.7* 38.4*  MCV 86.2 88.1  88.7 87.7  PLT 280 216 167 213   Cardiac Enzymes:  Recent Labs Lab 10/21/14 0520 10/21/14 1136 10/21/14 1711  TROPONINI <0.30 <0.30 <0.30   BNP (last 3 results) No results for input(s): PROBNP in the last 8760 hours. CBG:  Recent Labs Lab 10/21/14 0757 10/22/14 0749 10/23/14 0749  GLUCAP 83 78 83    No results found for this or any previous visit (from the past 240 hour(s)).   Studies: Dg Abd 2 Views  10/22/2014   CLINICAL DATA:  Abdominal pain, nausea, vomiting  EXAM: ABDOMEN - 2 VIEW  COMPARISON:  09/30/2014  FINDINGS: There is no bowel dilatation to suggest obstruction. There are no air-fluid levels. There is no evidence of pneumoperitoneum, portal venous gas, or pneumatosis. There are no pathologic calcifications along the expected course of the ureters.  The osseous structures are unremarkable.  IMPRESSION: Unremarkable abdominal series.  No bowel obstruction.   Electronically Signed   By: Elige KoHetal  Patel   On: 10/22/2014 11:07    Scheduled Meds: . acidophilus  1 capsule Oral Daily  . alprazolam  1-2 mg Oral BID  . docusate sodium  100 mg Oral BID  . feeding supplement (ENSURE COMPLETE)  237 mL Oral TID BM  . heparin  5,000 Units Subcutaneous 3 times per day  . metoCLOPramide (REGLAN) injection  10 mg Intravenous 4 times per day  . montelukast  10 mg Oral QHS  . pantoprazole (PROTONIX) IV  40 mg Intravenous QHS   Continuous Infusions: . sodium chloride 75 mL/hr at 10/23/14 [id number]    Principal Problem:   Abdominal pain Active Problems:   Tourette's syndrome   Generalized anxiety disorder   Protein-calorie malnutrition, severe   Gastroparesis   Chronic pain syndrome   Migraine headache   Elevated transaminase level   Abnormal EKG    Time spent: 45 minutes    Sapna Padron M.D. [facility] Pager [phone]. If 7PM-7AM, please contact night-coverage at [email or web address], password [email or web address] 10/23/2014, 9:38 AM  LOS: 3 days

## 2014-10-23 NOTE — Progress Notes (Signed)
[facility] Progress Note  Subjective:  Tolerating some full liquids.  No nausea or vomiting since Thursday but still complaining of constant mid/epigastric abdominal pain.  Is taking Dilaudid as well as his home oxycodone.  Also receiving Toradol on occasion as well.    Objective:  Vital signs in last 24 hours: Temp:  [98.1 F (36.7 C)-99 F (37.2 C)] 98.1 F (36.7 C) (12/21 0559) Pulse Rate:  [57-70] 57 (12/21 0559) Resp:  [18] 18 (12/21 0559) BP: (110-118)/(70-77) 110/70 mmHg (12/21 0559) SpO2:  [96 %-97 %] 96 % (12/21 0559) Last BM Date: 10/20/14 General:  Alert, thin, in NAD; anxious Heart:  Slightly bradycardic but regular rhythm; no murmurs Pulm:  CTAB.  No W/R/R. Abdomen:  Soft, non-distended. Normal bowel sounds.  Epigastric/mid-abdominal TTP without R/R/G. Extremities:  Without edema. Neurologic:  Alert and  oriented x4;  grossly normal neurologically. Psych:  Alert and cooperative. Normal mood and affect.  Intake/Output from previous day: 12/20 0701 - 12/21 0700 In: 3980 [I.V.:3980] Out: -   Lab Results:  Recent Labs  10/21/14 0520 10/22/14 0510 10/23/14 0450  WBC 8.0 5.1 5.2  HGB 12.6* 12.0* 12.5*  HCT 39.1 37.7* 38.4*  PLT 216 167 213   BMET  Recent Labs  10/21/14 0520 10/22/14 0510 10/23/14 0450  NA 138 141 140  K 3.4* 4.3 4.6  CL 100 105 103  CO2 _0 GLUCOSE 106* 84 99  BUN _1 CREATININE 0.88 0.81 0.88  CALCIUM 9.2 8.7 9.2   LFT  Recent Labs  10/21/14 0520  PROT 6.4  ALBUMIN 3.6  AST 22  ALT 75*  ALKPHOS 56  BILITOT 0.8   PT/INR  Recent Labs  10/21/14 0520  LABPROT 15.5*  INR 1.21   Hepatitis Panel  Recent Labs  10/21/14 0520  HEPBSAG NEGATIVE  HCVAB NEGATIVE  HEPAIGM NON REACTIVE  HEPBIGM NON REACTIVE    Dg Abd 2 Views  10/22/2014   CLINICAL DATA:  Abdominal pain, nausea, vomiting  EXAM: ABDOMEN - 2 VIEW  COMPARISON:  09/30/2014  FINDINGS: There is no bowel dilatation to suggest  obstruction. There are no air-fluid levels. There is no evidence of pneumoperitoneum, portal venous gas, or pneumatosis. There are no pathologic calcifications along the expected course of the ureters.  The osseous structures are unremarkable.  IMPRESSION: Unremarkable abdominal series.  No bowel obstruction.   Electronically Signed   By: Kathreen Devoid   On: 10/22/2014 11:07    Assessment / Plan: -38 year old male with presumed gastroparesis and chronic, intermittent abdominal pain.  Condition likely worsened by his chronic narcotic use and symptoms possibly somewhat worsened by his anxiety.  Continue Reglan 10 mg IV ac and hs, full liquid diet, Bentyl TID prn, and Protonix daily for now.  Try to limit narcotics.  Will plan for EGD tomorrow, 12/22, at 9:45 am.  **Follow up with Dr. Hilarie Fredrickson rescheduled for 12/13/2014 at 2:00 pm.   LOS: 3 days   ZEHR, JESSICA D.  10/23/2014, 9:05 AM  Pager number [phone]  Primary issue appears to be chronic abdominal pain.  Nausea likely is medication-related, secondary to gastroparesis.  Question of a bezoar was raised by prior endoscopy. 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  The key to treating this is slow narcotic withdrawal.  I discussed this with the patient and he is willing to "try anything."   Recommendations 1.  Begin gradual decrease in  narcotics 2.  Proceed with EGD 3.  Check inflammatory markers including CRP and ESR 4.  May try lidocaine patches on his knee as necessary  5.  May also consider other meds for chronic pain including elavil, neurontin, lyrica and cymbalta

## 2014-10-24 ENCOUNTER — Encounter (HOSPITAL_COMMUNITY)
Admission: EM | Disposition: A | Discharge status: Home / Self Care | Payer: Self-pay | Source: Home / Self Care | Attending: Internal Medicine

## 2014-10-24 ENCOUNTER — Inpatient Hospital Stay (HOSPITAL_COMMUNITY): Payer: Medicaid Other | Admitting: Certified Registered Nurse Anesthetist

## 2014-10-24 ENCOUNTER — Ambulatory Visit: Payer: Self-pay | Admitting: Internal Medicine

## 2014-10-24 ENCOUNTER — Encounter (HOSPITAL_COMMUNITY): Payer: Self-pay | Admitting: Certified Registered Nurse Anesthetist

## 2014-10-24 HISTORY — PX: ESOPHAGOGASTRODUODENOSCOPY (EGD) WITH PROPOFOL: SHX5813

## 2014-10-24 LAB — COMPREHENSIVE METABOLIC PANEL
ALT: 39 U/L (ref 0–53)
AST: 16 U/L (ref 0–37)
Albumin: 3.6 g/dL (ref 3.5–5.2)
Alkaline Phosphatase: 49 U/L (ref 39–117)
Anion gap: 6 (ref 5–15)
BILIRUBIN TOTAL: 0.6 mg/dL (ref 0.3–1.2)
BUN: 10 mg/dL (ref 6–23)
CALCIUM: 8.8 mg/dL (ref 8.4–10.5)
CHLORIDE: 105 meq/L (ref 96–112)
CO2: 29 mmol/L (ref 19–32)
CREATININE: 0.69 mg/dL (ref 0.50–1.35)
GFR calc Af Amer: >90 mL/min (ref >90)
Glucose, Bld: 112 mg/dL — ABNORMAL HIGH (ref 70–99)
Potassium: 3.5 mmol/L (ref 3.5–5.1)
SODIUM: 140 mmol/L (ref 135–145)
Total Protein: 5.9 g/dL — ABNORMAL LOW (ref 6.0–8.3)

## 2014-10-24 LAB — CBC
HEMATOCRIT: 37.2 % — AB (ref 39.0–52.0)
Hemoglobin: 12.3 g/dL — ABNORMAL LOW (ref 13.0–17.0)
MCH: 28.6 pg (ref 26.0–34.0)
MCHC: 33.1 g/dL (ref 30.0–36.0)
MCV: 86.5 fL (ref 78.0–100.0)
PLATELETS: 213 10*3/uL (ref 150–400)
RBC: 4.3 MIL/uL (ref 4.22–5.81)
RDW: 12.8 % (ref 11.5–15.5)
WBC: 4.6 10*3/uL (ref 4.0–10.5)

## 2014-10-24 LAB — GLUCOSE, CAPILLARY: Glucose-Capillary: 95 mg/dL (ref 70–99)

## 2014-10-24 SURGERY — ESOPHAGOGASTRODUODENOSCOPY (EGD) WITH PROPOFOL
Anesthesia: Monitor Anesthesia Care

## 2014-10-24 MED ORDER — PROPOFOL 10 MG/ML IV BOLUS
INTRAVENOUS | Status: AC
Start: 2014-10-24 — End: 2014-10-24
  Filled 2014-10-24: qty 20

## 2014-10-24 MED ORDER — MIDAZOLAM HCL 2 MG/2ML IJ SOLN
INTRAMUSCULAR | Status: AC
  Filled 2014-10-24: qty 2

## 2014-10-24 MED ORDER — FENTANYL CITRATE 0.05 MG/ML IJ SOLN
INTRAMUSCULAR | Status: DC | PRN
  Administered 2014-10-24 (×2): 50 ug via INTRAVENOUS

## 2014-10-24 MED ORDER — LACTATED RINGERS IV SOLN
INTRAVENOUS | Status: DC | PRN
  Administered 2014-10-24: 11:00:00 via INTRAVENOUS

## 2014-10-24 MED ORDER — POTASSIUM CHLORIDE CRYS ER 20 MEQ PO TBCR
40.0000 meq | EXTENDED_RELEASE_TABLET | Freq: Once | ORAL | Status: AC
  Administered 2014-10-24: 40 meq via ORAL
  Filled 2014-10-24: qty 2

## 2014-10-24 MED ORDER — METOCLOPRAMIDE HCL 5 MG/ML IJ SOLN
INTRAMUSCULAR | Status: DC | PRN
  Administered 2014-10-24: 5 mg via INTRAVENOUS

## 2014-10-24 MED ORDER — OXYCODONE HCL 5 MG PO TABS
15.0000 mg | ORAL_TABLET | Freq: Four times a day (QID) | ORAL | Status: DC | PRN
  Administered 2014-10-24 – 2014-10-25 (×4): 15 mg via ORAL
  Filled 2014-10-24 (×4): qty 3

## 2014-10-24 MED ORDER — PROPOFOL 10 MG/ML IV BOLUS
INTRAVENOUS | Status: DC | PRN
  Administered 2014-10-24: 60 mg via INTRAVENOUS
  Administered 2014-10-24: 20 mg via INTRAVENOUS
  Administered 2014-10-24: 60 mg via INTRAVENOUS
  Administered 2014-10-24: 20 mg via INTRAVENOUS
  Administered 2014-10-24 (×2): 40 mg via INTRAVENOUS

## 2014-10-24 MED ORDER — MIDAZOLAM HCL 5 MG/5ML IJ SOLN
INTRAMUSCULAR | Status: DC | PRN
  Administered 2014-10-24 (×2): 2 mg via INTRAVENOUS

## 2014-10-24 MED ORDER — FENTANYL CITRATE 0.05 MG/ML IJ SOLN
INTRAMUSCULAR | Status: AC
  Filled 2014-10-24: qty 2

## 2014-10-24 MED ORDER — SODIUM CHLORIDE 0.9 % IV SOLN: INTRAVENOUS | Status: DC

## 2014-10-24 MED ORDER — BUTAMBEN-TETRACAINE-BENZOCAINE 2-2-14 % EX AERO
INHALATION_SPRAY | CUTANEOUS | Status: DC | PRN
  Administered 2014-10-24: 2 via TOPICAL

## 2014-10-24 MED ORDER — PROPOFOL 10 MG/ML IV BOLUS
INTRAVENOUS | Status: AC
  Filled 2014-10-24: qty 40

## 2014-10-24 SURGICAL SUPPLY — 14 items

## 2014-10-24 NOTE — Progress Notes (Signed)
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 Clinical improvement. Patient started on lyrica last night. Patient has had extensive workup for this. Lipase is negative. Patient had a colonoscopy 08/11/2014 an EGD 09/01/2014 which were unremarkable. Patient states had a gastric emptying study which was consistent with gastroparesis. Patient stated that he had run out of most of his medications and in particular run out of his Reglan 4 days prior to admission. Patient denies any diarrhea. Discontinued IV erythromycin. Discontinued IV pain medications and started lyrica. Continue IV Reglan. Continue Hydroxyzine for nausea as patient had QTc prolongation. Continue Bentyl and PPI. Continue IV fluids. Supportive care. Pain management. KUB unremarkable. Se rate nl.Patient for upper endoscopy today. GI ff and appreciate input and rxcs.  #2 transaminitis Patient denies any alcohol abuse. May be secondary to dehydration. Acute hepatitis panel negative. LFTs trended down. Follow  #3 abnormal EKG Patient noted to have T-wave inversion in the inferior leads and V2 through V3 and slight QTc prolongation. Patient denies any active chest pain. No old EKG to compare to. Cardiac enzymes negative 3. Repeat EKG with less pronounced T-wave inversions. 2-D echo with EF of 55-60% with no wall motion abnormalities mild right atrial enlargement, mild TR, RVSP 26 mmHg. Will likely need outpatient follow-up with PCP. Will curbside cardiology. Follow.  #4 migraine headaches Sumatriptan when necessary.  #5 Tourette's syndrome Continue home regimen Xanax.  #6 Chronic pain Continue pain regimen. D/C'D IV Dilaudid yesterday. Continue oral home regimen of oxyIR. Lyrica started yesterday.  #7  prophylaxis Heparin for DVT prophylaxis.  Code Status: Full Family Communication: Updated patient. No family at bedside. Disposition Plan: Home when medically stable.   Consultants:  Gastroenterology: Dr. Elnoria HowardHung 10/22/2014  Procedures:  2-D echo 10/22/2014---LVEF 55-60%, normal wall thickness, wall motion and normal diastolic function, mild RAE, mild TR, RVSP 26 mmHg.  Acute abdominal series 10/22/2014 ----Unremarkable abdominal series. No bowel obstruction.   Antibiotics:  None  HPI/Subjective: Patient denies nausea and emesis. Patient states abdominal pain improving. Patient denies any chest pain. No shortness of breath.  Objective: Filed Vitals:   10/24/14 0518  BP: 98/78  Pulse: 64  Temp: 98 F (36.7 C)  Resp: 18    Intake/Output Summary (Last 24 hours) at 10/24/14 0802 Last data filed at 10/24/14 0500  Gross per 24 hour  Intake 3540.42 ml  Output      0 ml  Net 3540.42 ml   Filed Weights   10/21/14 0900  Weight: 58.1 kg (128 lb 1.4 oz)    Exam:   General:  NAD  Cardiovascular: RRR  Respiratory: CTAB  Abdomen: Soft, nondistended, nontender to palpation in the mid abdominal region, positive bowel sounds.  Musculoskeletal: No clubbing cyanosis or edema.  Data Reviewed: Basic Metabolic Panel:  Recent Labs Lab 10/20/14 1928 10/21/14 0520 10/22/14 0510 10/23/14 0450 10/24/14 0444  NA 140 138 141 140 140  K 4.1 3.4* 4.3 4.6 3.5  CL 97 100 105 103 105  CO2 27 28 28 30 29   GLUCOSE 114* 106* 84 99 112*  BUN 17 17 11 6 10   CREATININE 0.81 0.88 0.81 0.88 0.69  CALCIUM 10.6* 9.2 8.7 9.2 8.8   Liver Function Tests:  Recent Labs Lab 10/20/14 1928 10/21/14 0520 10/24/14 0444  AST 36 22 16  ALT 113* 75* 39  ALKPHOS 77  56 49  BILITOT 0.8 0.8 0.6  PROT 8.5* 6.4 5.9*  ALBUMIN 4.8 3.6 3.6    Recent Labs Lab 10/20/14 1928  LIPASE 53   No results for input(s): AMMONIA in the last 168 hours. CBC:  Recent Labs Lab 10/20/14 1928  10/21/14 0520 10/22/14 0510 10/23/14 0450 10/24/14 0444  WBC 9.4 8.0 5.1 5.2 4.6  NEUTROABS 6.6  --   --   --   --   HGB 15.3 12.6* 12.0* 12.5* 12.3*  HCT 46.4 39.1 37.7* 38.4* 37.2*  MCV 86.2 88.1 88.7 87.7 86.5  PLT 280 216 167 213 213   Cardiac Enzymes:  Recent Labs Lab 10/21/14 0520 10/21/14 1136 10/21/14 1711  TROPONINI <0.30 <0.30 <0.30   BNP (last 3 results) No results for input(s): PROBNP in the last 8760 hours. CBG:  Recent Labs Lab 10/21/14 0757 10/22/14 0749 10/23/14 0749 10/24/14 0747  GLUCAP 83 78 83 95    No results found for this or any previous visit (from the past 240 hour(s)).   Studies: Dg Abd 2 Views  10/22/2014   CLINICAL DATA:  Abdominal pain, nausea, vomiting  EXAM: ABDOMEN - 2 VIEW  COMPARISON:  09/30/2014  FINDINGS: There is no bowel dilatation to suggest obstruction. There are no air-fluid levels. There is no evidence of pneumoperitoneum, portal venous gas, or pneumatosis. There are no pathologic calcifications along the expected course of the ureters.  The osseous structures are unremarkable.  IMPRESSION: Unremarkable abdominal series.  No bowel obstruction.   Electronically Signed   By: Elige KoHetal  Patel   On: 10/22/2014 11:07    Scheduled Meds: . acidophilus  1 capsule Oral Daily  . alprazolam  1-2 mg Oral BID  . docusate sodium  100 mg Oral BID  . feeding supplement (ENSURE COMPLETE)  237 mL Oral TID BM  . heparin  5,000 Units Subcutaneous 3 times per day  . metoCLOPramide (REGLAN) injection  10 mg Intravenous 4 times per day  . montelukast  10 mg Oral QHS  . pantoprazole  40 mg Oral Q0600  . pregabalin  25 mg Oral Daily   Continuous Infusions: . sodium chloride 75 mL/hr at 10/23/14 1420    Principal Problem:   Abdominal pain Active Problems:   Tourette's syndrome   Generalized anxiety disorder   Protein-calorie malnutrition, severe   Gastroparesis   Chronic pain syndrome   Migraine headache   Elevated transaminase level    Abnormal EKG   Periumbilical abdominal pain    Time spent: 45 minutes    Evert Wenrich M.D. [facility] Pager [phone]. If 7PM-7AM, please contact night-coverage at [email or web address], password [email or web address] 10/24/2014, 8:02 AM  LOS: 4 days

## 2014-10-24 NOTE — Anesthesia Postprocedure Evaluation (Signed)
Anesthesia Post Note  Patient: Shane MorinRoss M Navis  Procedure(s) Performed: Procedure(s) (LRB): ESOPHAGOGASTRODUODENOSCOPY (EGD) WITH PROPOFOL (N/A)  Anesthesia type: MAC  Patient location: PACU  Post pain: Pain level controlled  Post assessment: Post-op Vital signs reviewed  Last Vitals: BP 126/76 mmHg  Pulse 98  Temp(Src) 36.4 C (Oral)  Resp 16  Ht 5\' 9"  (1.753 m)  Wt 128 lb 1.4 oz (58.1 kg)  BMI 18.91 kg/m2  SpO2 97%  Post vital signs: Reviewed  Level of consciousness: awake  Complications: No apparent anesthesia complications

## 2014-10-24 NOTE — Interval H&P Note (Signed)
History and Physical Interval Note:  10/24/2014 10:22 AM  Shane Curry  has presented today for surgery, with the diagnosis of Abdominal pain, nausea/vomiting; gastroparesis  The various methods of treatment have been discussed with the patient and family. After consideration of risks, benefits and other options for treatment, the patient has consented to  Procedure(s): ESOPHAGOGASTRODUODENOSCOPY (EGD) WITH PROPOFOL (N/A) as a surgical intervention .  The patient's history has been reviewed, patient examined, no change in status, stable for surgery.  I have reviewed the patient's chart and labs.  Questions were answered to the patient's satisfaction.     The recent H&P (dated *10/23/14*) was reviewed, the patient was examined and there is no change in the patients condition since that H&P was completed.   Shane Curry  10/24/2014, 10:22 AM    Shane Curry

## 2014-10-24 NOTE — Op Note (Signed)
[facility] [address] [place], [id number]   ENDOSCOPY PROCEDURE REPORT  PATIENT: [name], Shane M  MR#: [id number] BIRTHDATE: 28-Jul-1976 , 38  yrs. old GENDER: male ENDOSCOPIST: Louis Meckelobert D Trenden Hazelrigg, MD REFERRED BY: PROCEDURE DATE:  10/24/2014 PROCEDURE:  EGD, diagnostic ASA CLASS:     Class II INDICATIONS:  epigastric pain and nausea. MEDICATIONS: Monitored anesthesia care TOPICAL ANESTHETIC:  DESCRIPTION OF PROCEDURE: After the risks benefits and alternatives of the procedure were thoroughly explained, informed consent was obtained.  The PENTAX GASTOROSCOPE [id number] endoscope was introduced through the mouth and advanced to the second portion of the duodenum , Without limitations.  The instrument was slowly withdrawn as the mucosa was fully examined.      EXAM: The esophagus and gastroesophageal junction were completely normal in appearance.  The stomach was entered and closely examined.The antrum, angularis, and lesser curvature were well visualized, including a retroflexed view of the cardia and fundus. The stomach wall was normally distensable.  The scope passed easily through the pylorus into the duodenum.  Retroflexed views revealed no abnormalities.     The scope was then withdrawn from the patient and the procedure completed.  COMPLICATIONS: There were no immediate complications.  ENDOSCOPIC IMPRESSION: Normal appearing esophagus and GE junction, the stomach was well visualized and normal in appearance, normal appearing duodenum  RECOMMENDATIONS: Continue metoclopramide  REPEAT EXAM:  eSigned:  Louis Meckelobert D Esiquio Boesen, MD 10/24/2014 10:57 AM    CC: Shane HarpXilin Niu, MD , Erick BlinksJay Pyrtle, MD

## 2014-10-24 NOTE — Transfer of Care (Signed)
Immediate Anesthesia Transfer of Care Note  Patient: Shane Curry  Procedure(s) Performed: Procedure(s): ESOPHAGOGASTRODUODENOSCOPY (EGD) WITH PROPOFOL (N/A)  Patient Location: PACU  Anesthesia Type:MAC  Level of Consciousness: awake, alert  and oriented  Airway & Oxygen Therapy: Patient Spontanous Breathing and Patient connected to nasal cannula oxygen  Post-op Assessment: Report given to PACU RN, Post -op Vital signs reviewed and stable and Patient moving all extremities  Post vital signs: Reviewed and stable  Complications: No apparent anesthesia complications

## 2014-10-24 NOTE — H&P (View-Only) (Signed)
[facility] Progress Note  Subjective:  Tolerating some full liquids.  No nausea or vomiting since Thursday but still complaining of constant mid/epigastric abdominal pain.  Is taking Dilaudid as well as his home oxycodone.  Also receiving Toradol on occasion as well.    Objective:  Vital signs in last 24 hours: Temp:  [98.1 F (36.7 C)-99 F (37.2 C)] 98.1 F (36.7 C) (12/21 0559) Pulse Rate:  [57-70] 57 (12/21 0559) Resp:  [18] 18 (12/21 0559) BP: (110-118)/(70-77) 110/70 mmHg (12/21 0559) SpO2:  [96 %-97 %] 96 % (12/21 0559) Last BM Date: 10/20/14 General:  Alert, thin, in NAD; anxious Heart:  Slightly bradycardic but regular rhythm; no murmurs Pulm:  CTAB.  No W/R/R. Abdomen:  Soft, non-distended. Normal bowel sounds.  Epigastric/mid-abdominal TTP without R/R/G. Extremities:  Without edema. Neurologic:  Alert and  oriented x4;  grossly normal neurologically. Psych:  Alert and cooperative. Normal mood and affect.  Intake/Output from previous day: 12/20 0701 - 12/21 0700 In: 3980 [I.V.:3980] Out: -   Lab Results:  Recent Labs  10/21/14 0520 10/22/14 0510 10/23/14 0450  WBC 8.0 5.1 5.2  HGB 12.6* 12.0* 12.5*  HCT 39.1 37.7* 38.4*  PLT 216 167 213   BMET  Recent Labs  10/21/14 0520 10/22/14 0510 10/23/14 0450  NA 138 141 140  K 3.4* 4.3 4.6  CL 100 105 103  CO2 _0 GLUCOSE 106* 84 99  BUN _1 CREATININE 0.88 0.81 0.88  CALCIUM 9.2 8.7 9.2   LFT  Recent Labs  10/21/14 0520  PROT 6.4  ALBUMIN 3.6  AST 22  ALT 75*  ALKPHOS 56  BILITOT 0.8   PT/INR  Recent Labs  10/21/14 0520  LABPROT 15.5*  INR 1.21   Hepatitis Panel  Recent Labs  10/21/14 0520  HEPBSAG NEGATIVE  HCVAB NEGATIVE  HEPAIGM NON REACTIVE  HEPBIGM NON REACTIVE    Dg Abd 2 Views  10/22/2014   CLINICAL DATA:  Abdominal pain, nausea, vomiting  EXAM: ABDOMEN - 2 VIEW  COMPARISON:  09/30/2014  FINDINGS: There is no bowel dilatation to suggest  obstruction. There are no air-fluid levels. There is no evidence of pneumoperitoneum, portal venous gas, or pneumatosis. There are no pathologic calcifications along the expected course of the ureters.  The osseous structures are unremarkable.  IMPRESSION: Unremarkable abdominal series.  No bowel obstruction.   Electronically Signed   By: Kathreen Devoid   On: 10/22/2014 11:07    Assessment / Plan: -38 year old male with presumed gastroparesis and chronic, intermittent abdominal pain.  Condition likely worsened by his chronic narcotic use and symptoms possibly somewhat worsened by his anxiety.  Continue Reglan 10 mg IV ac and hs, full liquid diet, Bentyl TID prn, and Protonix daily for now.  Try to limit narcotics.  Will plan for EGD tomorrow, 12/22, at 9:45 am.  **Follow up with Dr. Hilarie Fredrickson rescheduled for 12/13/2014 at 2:00 pm.   LOS: 3 days   ZEHR, JESSICA D.  10/23/2014, 9:05 AM  Pager number [phone]  Primary issue appears to be chronic abdominal pain.  Nausea likely is medication-related, secondary to gastroparesis.  Question of a bezoar was raised by prior endoscopy. 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  The key to treating this is slow narcotic withdrawal.  I discussed this with the patient and he is willing to "try anything."   Recommendations 1.  Begin gradual decrease in  narcotics 2.  Proceed with EGD 3.  Check inflammatory markers including CRP and ESR 4.  May try lidocaine patches on his knee as necessary  5.  May also consider other meds for chronic pain including elavil, neurontin, lyrica and cymbalta

## 2014-10-24 NOTE — Progress Notes (Signed)
Upper endoscopy was entirely normal. 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  I reduced oxycodone from every 4 hours to every 6 hours.  I would encourage him to take narcotics as infrequently as possible.  Recommend switching to oral medications and advancing diet.  Once he tolerates diet he can be discharged.

## 2014-10-24 NOTE — Anesthesia Preprocedure Evaluation (Signed)
Anesthesia Evaluation  Patient identified by MRN, date of birth, ID band Patient awake    Reviewed: Allergy & Precautions, H&P , NPO status , Patient's Chart, lab work & pertinent test results  Airway        Dental   Pulmonary neg pulmonary ROS, former smoker,          Cardiovascular negative cardio ROS      Neuro/Psych  Headaches, PSYCHIATRIC DISORDERS Anxiety negative neurological ROS     GI/Hepatic negative GI ROS, Neg liver ROS,   Endo/Other  negative endocrine ROS  Renal/GU negative Renal ROS     Musculoskeletal  (+) Arthritis -,   Abdominal   Peds  Hematology negative hematology ROS (+)   Anesthesia Other Findings   Reproductive/Obstetrics                             Anesthesia Physical Anesthesia Plan  ASA: II  Anesthesia Plan: MAC   Post-op Pain Management:    Induction: Intravenous  Airway Management Planned:   Additional Equipment:   Intra-op Plan:   Post-operative Plan:   Informed Consent: I have reviewed the patients History and Physical, chart, labs and discussed the procedure including the risks, benefits and alternatives for the proposed anesthesia with the patient or authorized representative who has indicated his/her understanding and acceptance.   Dental advisory given  Plan Discussed with: CRNA  Anesthesia Plan Comments:         Anesthesia Quick Evaluation

## 2014-10-25 ENCOUNTER — Encounter (HOSPITAL_COMMUNITY): Payer: Self-pay | Admitting: Gastroenterology

## 2014-10-25 LAB — CBC
HEMATOCRIT: 36.9 % — AB (ref 39.0–52.0)
Hemoglobin: 12 g/dL — ABNORMAL LOW (ref 13.0–17.0)
MCH: 28 pg (ref 26.0–34.0)
MCHC: 32.5 g/dL (ref 30.0–36.0)
MCV: 86.2 fL (ref 78.0–100.0)
PLATELETS: 191 10*3/uL (ref 150–400)
RBC: 4.28 MIL/uL (ref 4.22–5.81)
RDW: 13 % (ref 11.5–15.5)
WBC: 5.7 10*3/uL (ref 4.0–10.5)

## 2014-10-25 LAB — BASIC METABOLIC PANEL
ANION GAP: 3 — AB (ref 5–15)
BUN: 8 mg/dL (ref 6–23)
CALCIUM: 8.9 mg/dL (ref 8.4–10.5)
CHLORIDE: 108 meq/L (ref 96–112)
CO2: 30 mmol/L (ref 19–32)
Creatinine, Ser: 0.75 mg/dL (ref 0.50–1.35)
GFR calc Af Amer: >90 mL/min (ref >90)
GFR calc non Af Amer: >90 mL/min (ref >90)
Glucose, Bld: 102 mg/dL — ABNORMAL HIGH (ref 70–99)
Potassium: 4.3 mmol/L (ref 3.5–5.1)
Sodium: 141 mmol/L (ref 135–145)

## 2014-10-25 LAB — GLUCOSE, CAPILLARY: Glucose-Capillary: 93 mg/dL (ref 70–99)

## 2014-10-25 MED ORDER — METOCLOPRAMIDE HCL 10 MG PO TABS: 10.0000 mg | ORAL_TABLET | Freq: Three times a day (TID) | ORAL | Status: AC

## 2014-10-25 MED ORDER — OXYCODONE HCL 15 MG PO TABS: 15.0000 mg | ORAL_TABLET | ORAL | Status: AC | PRN

## 2014-10-25 NOTE — Discharge Summary (Signed)
Physician Discharge Summary  Shane MorinRoss M Curry ZOX:[id number] DOB: [birth year] DOA: 10/20/2014  PCP: Tally DueGUEST, [name], MD  Admit date: 10/20/2014 Discharge date: 10/25/2014  Time spent: 40 minutes  Recommendations for Outpatient Follow-up:  1. Follow-up with primary care physician and Dr. Arlyce DiceKaplan within one week.  Discharge Diagnoses:  Principal Problem:   Abdominal pain Active Problems:   Tourette's syndrome   Generalized anxiety disorder   Protein-calorie malnutrition, severe   Gastroparesis   Chronic pain syndrome   Migraine headache   Elevated transaminase level   Abnormal EKG   Periumbilical abdominal pain   Discharge Condition: Stable  Diet recommendation: Heart healthy  Filed Weights   10/21/14 0900  Weight: 58.1 kg (128 lb 1.4 oz)    History of present illness:  Shane Curry is a 38 y.o. male with past medical history of Torette syndrome, migraine headache, gastroparesis, who presents with abdominal pain and nausea vomiting.  Patient has history of gastroparesis with recurrent abdominal pain. He has been admitted several times in the last few months and has had negative colonoscopy and endoscopy.  He had several CT scan of the abdomen, one of CT-abd showed possible colitis on 08/09/14, however following colonoscopy and EGD were negative, including biopsy. He always have some level of abdominal pain chronically. Patient reports that in the past 4 days, his abdominal pain has been progressively getting worse. It is associated with nausea, vomiting, but no diarrhea. He has chills, but no fever. Patient has chronic bilateral knee pain, denies fever, fatigue, headaches, cough, chest pain, SOB, diarrhea, dysuria, urgency, frequency, hematuria, skin rashes or leg swelling.  Work up in the ED demonstrates negative urinalysis, lipase 53, AST 113, ALT 38, normal bilirubin, no leukocytosis. Patient is admitted to inpatient for further evaluation and treatment.  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  Clinical improvement. Patient has had extensive workup for this. Lipase is negative.  Patient repeat endoscopy done yesterday and showed no evidence of abnormalities. 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 Patient decreased from 15 mg of oxycodone every 4 hours to every 6 hours. Hopefully patient can take these medications on needed basis instead of scheduled. Abdominal pain is better, tolerated food since yesterday. Will be discharged home on Reglan and I prescribed 30 tablets of oxycodone daily until he sees his pain clinic on January 1.  Transaminitis Patient denies any alcohol abuse. May be secondary to dehydration. Acute hepatitis panel negative. LFTs trended down. Follow  Abnormal EKG Patient noted to have T-wave inversion in the inferior leads and V2 through V3 and slight QTc prolongation. Patient denies any active chest pain. No old EKG to compare to. Cardiac enzymes negative 3. Repeat EKG with less pronounced T-wave inversions. 2-D echo with EF of 55-60% with no wall motion abnormalities mild right atrial enlargement, mild TR, RVSP 26 mmHg. Will likely need outpatient follow-up with PCP. Will curbside cardiology. Follow.  Migraine headaches Sumatriptan when necessary.  Tourette's syndrome Continue home regimen Xanax.  Chronic pain Patient is on chronic pain medication secondary to chronic knee pain?. Patient instructed to follow-up with his orthopedic doctor to see if surgical intervention can keep him off of taking narcotics. Need to be tapered off of his narcotics, patient follows with [facility].  Procedures:  EGD done by Dr. Arlyce DiceKaplan on 10/24/2014 ENDOSCOPIC IMPRESSION: Normal appearing esophagus and GE junction, the stomach  was well visualized and normal in appearance, normal  appearing duodenum  Consultations:  Gastroenterology  Discharge Exam: Filed Vitals:   10/25/14 0649  BP: 112/76  Pulse: 61  Temp: 98.4 F (36.9 C)  Resp: 16   General: Alert and awake, oriented x3, not in any acute distress. HEENT: anicteric sclera, pupils reactive to light and accommodation, EOMI CVS: S1-S2 clear, no murmur rubs or gallops Chest: clear to auscultation bilaterally, no wheezing, rales or rhonchi Abdomen: soft nontender, nondistended, normal bowel sounds, no organomegaly Extremities: no cyanosis, clubbing or edema noted bilaterally Neuro: Cranial nerves II-XII intact, no focal neurological deficits  Discharge Instructions   Discharge Instructions    Increase activity slowly    Complete by:  As directed           Current Discharge Medication List    CONTINUE these medications which have CHANGED   Details  metoCLOPramide (REGLAN) 10 MG tablet Take 1 tablet (10 mg total) by mouth 4 (four) times daily -  before meals and at bedtime. Qty: 90 tablet, Refills: 1   Associated Diagnoses: Gastroparesis    oxyCODONE (ROXICODONE) 15 MG immediate release tablet Take 1 tablet (15 mg total) by mouth every 4 (four) hours as needed for pain. Qty: 30 tablet, Refills: 0      CONTINUE these medications which have NOT CHANGED   Details  alprazolam (XANAX) 2 MG tablet Take 1-2 mg by mouth 2 (two) times daily.     dicyclomine (BENTYL) 20 MG tablet Take 1 tablet (20 mg total) by mouth 3 (three) times daily as needed for spasms. Qty: 90 tablet, Refills: 1    montelukast (SINGULAIR) 10 MG tablet Take 1 tablet (10 mg total) by mouth at bedtime. Qty: 30 tablet, Refills: 1    omeprazole (PRILOSEC) 20 MG capsule Take 2 capsules (40 mg total) by mouth 2 (two) times daily before a meal. Qty: 90 capsule, Refills: 3    Probiotic Product (PROBIOTIC PO) Take 1 tablet by mouth daily.    rizatriptan (MAXALT) 10 MG tablet Take 1 tablet (10 mg total) by mouth as needed for migraine  (migraine). May repeat in 2 hours if needed Qty: 30 tablet, Refills: 1    zolpidem (AMBIEN) 10 MG tablet Take 1 tablet (10 mg total) by mouth at bedtime as needed for sleep. Qty: 30 tablet, Refills: 1    Alum & Mag Hydroxide-Simeth (GI COCKTAIL) SUSP suspension Take 30 mLs by mouth 3 (three) times daily as needed for indigestion. Shake well. Qty: 2700 mL, Refills: 1    clarithromycin (BIAXIN) 500 MG tablet Take 1 tablet (500 mg total) by mouth 2 (two) times daily. Qty: 28 tablet, Refills: 0    cyclobenzaprine (FLEXERIL) 5 MG tablet Take 1 tablet (5 mg total) by mouth 3 (three) times daily as needed for muscle spasms. Qty: 60 tablet, Refills: 0    feeding supplement, ENSURE COMPLETE, (ENSURE COMPLETE) LIQD Take 237 mLs by mouth 3 (three) times daily between meals. Qty: 90 Bottle, Refills: 3       Allergies  Allergen Reactions  . Penicillins     Childhood reaction    Follow-up Information    Follow up with Beverley FiedlerPYRTLE, JAY M, MD On 12/13/2014.   Specialty:  Gastroenterology   Why:  2:00 pm   Contact information:   [address] [place] [address] [phone]        The results of significant diagnostics from this hospitalization (including imaging, microbiology, ancillary and laboratory) are listed  below for reference.    Significant Diagnostic Studies: Dg Abd 2 Views  10/22/2014   CLINICAL DATA:  Abdominal pain, nausea, vomiting  EXAM: ABDOMEN - 2 VIEW  COMPARISON:  09/30/2014  FINDINGS: There is no bowel dilatation to suggest obstruction. There are no air-fluid levels. There is no evidence of pneumoperitoneum, portal venous gas, or pneumatosis. There are no pathologic calcifications along the expected course of the ureters.  The osseous structures are unremarkable.  IMPRESSION: Unremarkable abdominal series.  No bowel obstruction.   Electronically Signed   By: Elige Ko   On: 10/22/2014 11:07   Dg Abd 2 Views  09/30/2014   CLINICAL DATA:  Acute onset of left lower  quadrant abdominal pain for 1 day. Nausea and vomiting. Initial encounter.  EXAM: ABDOMEN - 2 VIEW  COMPARISON:  CT of the abdomen and pelvis from 08/09/2014, and abdominal radiograph performed 08/06/2012  FINDINGS: The visualized bowel gas pattern is unremarkable. Scattered air and stool filled loops of colon are seen; no abnormal dilatation of small bowel loops is seen to suggest small bowel obstruction. No free intra-abdominal air is identified on the provided upright view.  The visualized osseous structures are within normal limits; the sacroiliac joints are unremarkable in appearance. The visualized lung bases are essentially clear.  IMPRESSION: Unremarkable bowel gas pattern; no free intra-abdominal air seen. Small amount of stool noted in the colon.   Electronically Signed   By: Roanna Raider M.D.   On: 09/30/2014 21:57    Microbiology: No results found for this or any previous visit (from the past 240 hour(s)).   Labs: Basic Metabolic Panel:  Recent Labs Lab 10/21/14 0520 10/22/14 0510 10/23/14 0450 10/24/14 0444 10/25/14 0526  NA 138 141 140 140 141  K 3.4* 4.3 4.6 3.5 4.3  CL 100 105 103 105 108  CO2 28 28 30 29 30   GLUCOSE 106* 84 99 112* 102*  BUN 17 11 6 10 8   CREATININE 0.88 0.81 0.88 0.69 0.75  CALCIUM 9.2 8.7 9.2 8.8 8.9   Liver Function Tests:  Recent Labs Lab 10/20/14 1928 10/21/14 0520 10/24/14 0444  AST 36 22 16  ALT 113* 75* 39  ALKPHOS 77 56 49  BILITOT 0.8 0.8 0.6  PROT 8.5* 6.4 5.9*  ALBUMIN 4.8 3.6 3.6    Recent Labs Lab 10/20/14 1928  LIPASE 53   No results for input(s): AMMONIA in the last 168 hours. CBC:  Recent Labs Lab 10/20/14 1928 10/21/14 0520 10/22/14 0510 10/23/14 0450 10/24/14 0444 10/25/14 0526  WBC 9.4 8.0 5.1 5.2 4.6 5.7  NEUTROABS 6.6  --   --   --   --   --   HGB 15.3 12.6* 12.0* 12.5* 12.3* 12.0*  HCT 46.4 39.1 37.7* 38.4* 37.2* 36.9*  MCV 86.2 88.1 88.7 87.7 86.5 86.2  PLT 280 216 167 213 213 191   Cardiac  Enzymes:  Recent Labs Lab 10/21/14 0520 10/21/14 1136 10/21/14 1711  TROPONINI <0.30 <0.30 <0.30   BNP: BNP (last 3 results) No results for input(s): PROBNP in the last 8760 hours. CBG:  Recent Labs Lab 10/21/14 0757 10/22/14 0749 10/23/14 0749 10/24/14 0747 10/25/14 0751  GLUCAP 83 78 83 95 93       Signed:  Emmelia Holdsworth A  [facility] 10/25/2014, 9:43 AM

## 2014-10-25 NOTE — Progress Notes (Signed)
Being discharged today.  EGD normal on 12/22.  Needs to gradually reduce his narcotic use, which should be done initially by decreasing oxycodone from every 4 hours to every 6 hours for 5 days then every 8 hours for 5 days then every 12 hours for 5 days.  Otherwise should eat small meals as tolerated.  Has follow-up with Dr. Rhea BeltonPyrtle scheduled as listed in discharge instructions.  Advised that he will be sent home with enough pain medication to get him through to the new year when he can be seen by either PCP, pain management, or ortho.  Advised him that our office will not be prescribing his narcotics that he has been taking for years for his knee pain.  GI Attending Note  I have personally taken an interval history, reviewed the chart, and examined the patient.  I agree with the extender's note, impression and recommendations.  Barbette Hairobert D. Arlyce DiceKaplan, MD, [facility] [phone]

## 2014-10-25 NOTE — Progress Notes (Signed)
CARE MANAGEMENT NOTE 10/25/2014  Patient:  Shane Curry,Shane Curry   Account Number:  [id number]  Date Initiated:  10/24/2014  Documentation initiated by:  Ferdinand CavaSCHETTINO,Onelia Cadmus  Subjective/Objective Assessment:   38 yo male admitted with abdominal pain from home     Action/Plan:   discharge planning   Anticipated DC Date:  10/26/2014   Anticipated DC Plan:  HOME/SELF CARE      DC Planning Services  CM consult      Choice offered to / List presented to:             Status of service:  Completed, signed off Medicare Important Message given?   (If response is "NO", the following Medicare IM given date fields will be blank) Date Medicare IM given:   Medicare IM given by:   Date Additional Medicare IM given:   Additional Medicare IM given by:    Discharge Disposition:  HOME/SELF CARE  Per UR Regulation:    If discussed at Long Length of Stay Meetings, dates discussed:    Comments:  10/25/14 Ferdinand CavaAndrea Schettino RN BSn CM [phone] Patient requested assistance with discharge medications. Informed patient about GoodRx website and prices related to dc meds, rpinted coupon that patient can use to afford meds, total cost $25.50 and patient stated he can afford that, he also stated that he has the [employer] app on his phone.  10/25/14 Ferdinand CavaAndrea Schettino RN BSn CM [phone] Patient stated [facility] and he did run out of medications due to affordability. Discussed the [facility] and medications. [facility].

## 2014-10-25 NOTE — Progress Notes (Signed)
Patient given discharge instructions, and verbalized an understanding of all discharge instructions.  Patient agrees with discharge plan, and is being discharged in stable medical condition.  Philomena Dohenyavid Miryam Mcelhinney RN

## 2014-11-07 LAB — C-REACTIVE PROTEIN

## 2014-12-01 ENCOUNTER — Encounter: Payer: Self-pay | Admitting: *Deleted

## 2014-12-04 IMAGING — CR DG ABDOMEN 2V
3 series · 3 of 3 positions shown · non-contrast
Comparison: CT of the abdomen and pelvis from 08/09/2014, and
abdominal radiograph performed 08/06/2012

CLINICAL DATA: Acute onset of left lower quadrant abdominal pain
for 1 day. Nausea and vomiting. Initial encounter.

EXAM:
ABDOMEN - 2 VIEW

[w abdomen upright *]
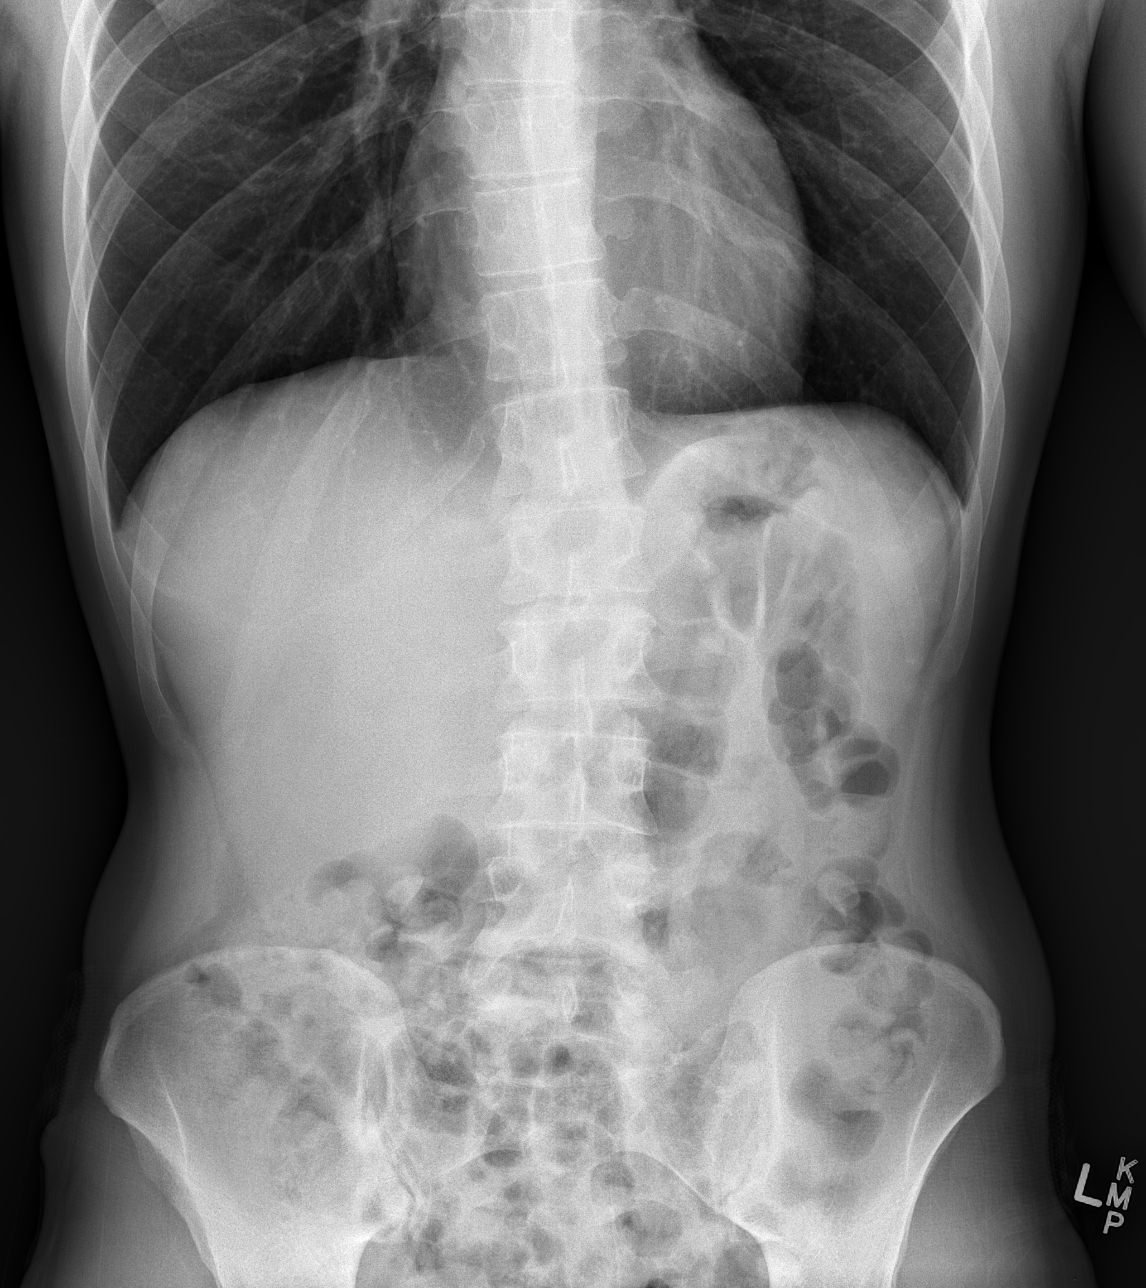

[t abdomen supine (1 of 2)]
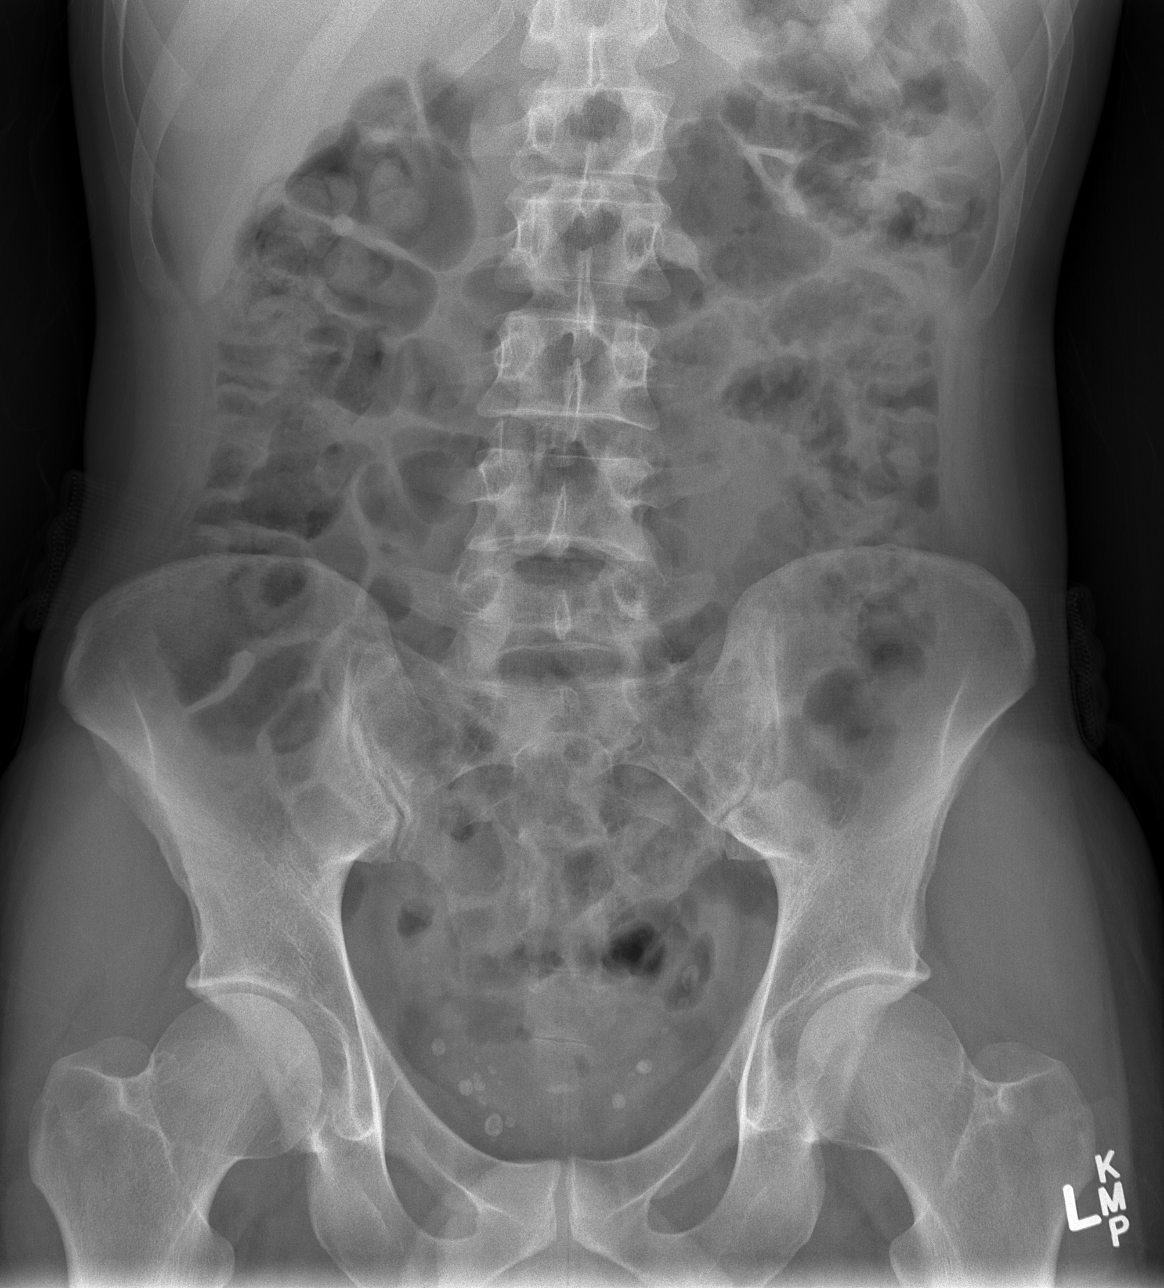

[t abdomen supine (2 of 2)]
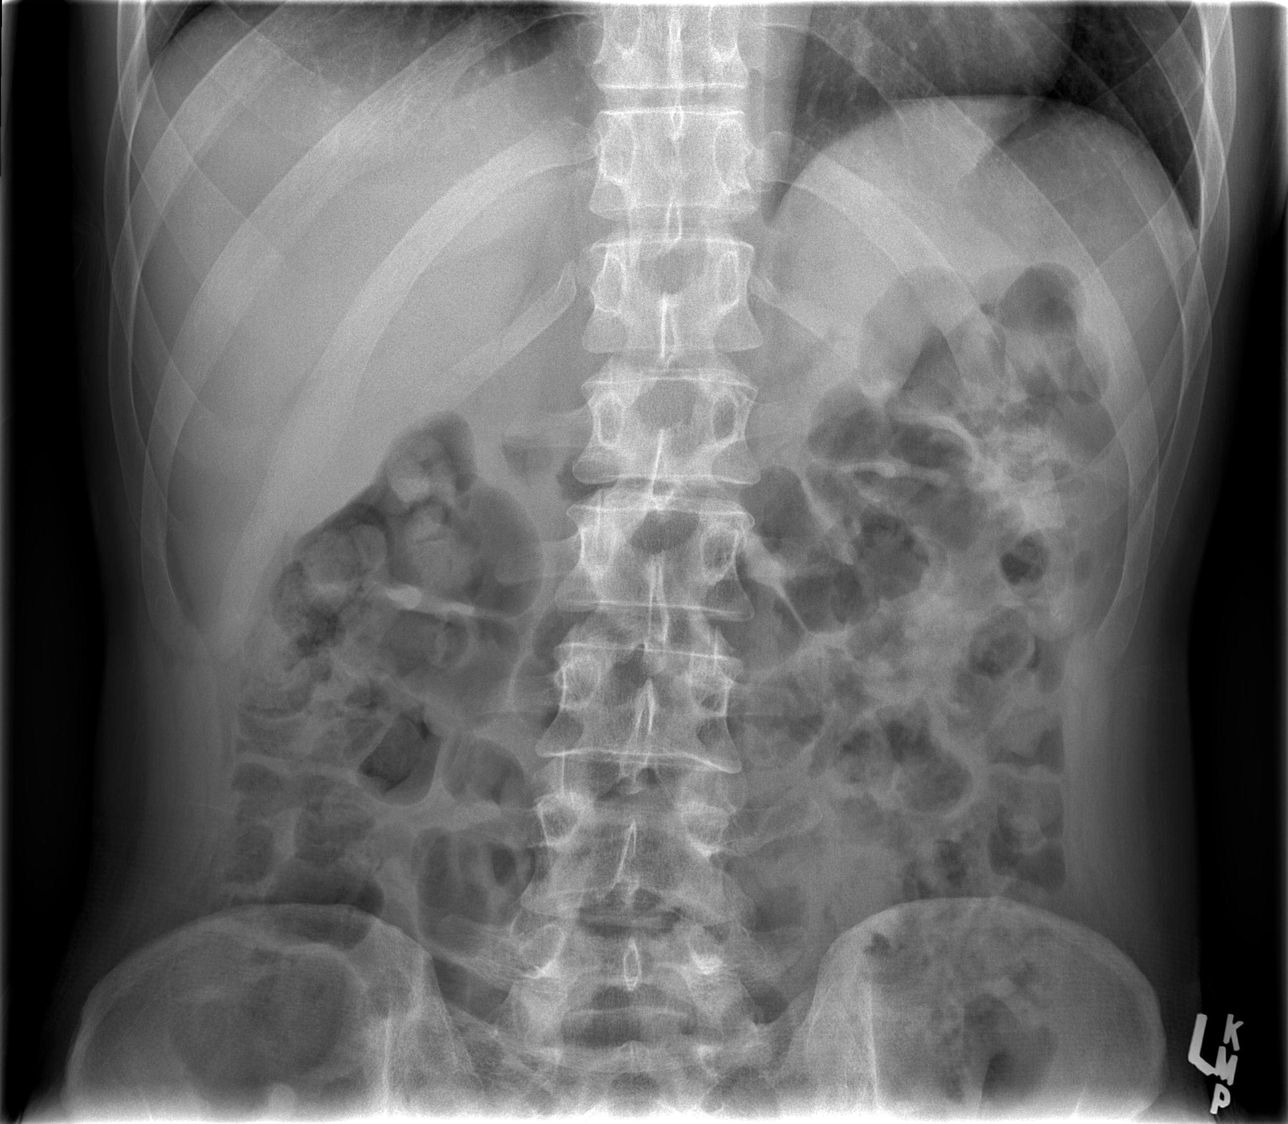

[3 of 3 positions shown; findings below may reference images not displayed]

FINDINGS: The visualized bowel gas pattern is unremarkable. Scattered air and
stool filled loops of colon are seen; no abnormal dilatation of
small bowel loops is seen to suggest small bowel obstruction. No
free intra-abdominal air is identified on the provided upright view.

The visualized osseous structures are within normal limits; the
sacroiliac joints are unremarkable in appearance. The visualized
lung bases are essentially clear.
IMPRESSION: Unremarkable bowel gas pattern; no free intra-abdominal air seen.
Small amount of stool noted in the colon.

## 2014-12-11 ENCOUNTER — Telehealth: Payer: Self-pay | Admitting: Internal Medicine

## 2014-12-11 MED ORDER — DICYCLOMINE HCL 20 MG PO TABS: 20.0000 mg | ORAL_TABLET | Freq: Three times a day (TID) | ORAL | Status: AC | PRN

## 2014-12-11 NOTE — Telephone Encounter (Signed)
Dr Rhea BeltonPyrtle- Patient had an appointment with you for 12/13/14 for abdominal pain, nausea etc. However, he cancelled that. He now wants refills on dicyclomine after a recent hospitalization. Do you want me to refill or does he need to follow up with you in the office?

## 2014-12-11 NOTE — Telephone Encounter (Signed)
Rx sent. Left message advising patient of this.

## 2014-12-11 NOTE — Telephone Encounter (Signed)
Ok to refill 

## 2014-12-13 ENCOUNTER — Ambulatory Visit: Payer: Self-pay | Admitting: Internal Medicine

## 2014-12-29 ENCOUNTER — Telehealth: Payer: Self-pay | Admitting: Internal Medicine

## 2014-12-29 NOTE — Telephone Encounter (Signed)
Patient notified

## 2014-12-29 NOTE — Telephone Encounter (Signed)
Would be okay with ibuprofen 400 mg TIDPRN

## 2014-12-29 NOTE — Telephone Encounter (Signed)
Patient wants to resume NSAIDS for his knee pain.  He is asking what is safe to take and how often.  Please advise

## 2014-12-29 NOTE — Telephone Encounter (Signed)
Calling to check if there is an answer to his questions as of yet. I assured him we would get back to him by the end of the day.

## 2014-12-29 NOTE — Telephone Encounter (Signed)
Per pt he was taking medications for his knees that began to cause Gi problems. He says he is doing a lot better, and has been following his instructions all the way. He would like to know if he can start taking something for his knees which continue to cause him pain.

## 2015-02-26 ENCOUNTER — Ambulatory Visit: Payer: Medicaid Other | Admitting: Family Medicine

## 2015-02-26 ENCOUNTER — Ambulatory Visit (INDEPENDENT_AMBULATORY_CARE_PROVIDER_SITE_OTHER): Payer: 59 | Admitting: Physician Assistant

## 2015-02-26 VITALS — BP 120/88 | HR 42 | Temp 98.0°F | Resp 16 | Ht 69.5 in | Wt 152.0 lb

## 2015-02-26 DIAGNOSIS — M25562 Pain in left knee: Secondary | ICD-10-CM | POA: Diagnosis not present

## 2015-02-26 NOTE — Patient Instructions (Signed)
We've put your referral in to see Dr Turner Danielsowan. They should be in contact with you to schedule the appointment.

## 2015-02-26 NOTE — Progress Notes (Signed)
   Subjective:    Patient ID: Shane Curry, male    DOB: 17-Aug-1976, 39 y.o.   MRN: [id number]  Chief Complaint  Patient presents with  . Referral    To orthopedist    HPI  [age] yom presents needing ortho referral.   Left meniscus surgery 2011 per pt. Persistent knee pain since the procedure requiring prescription pain meds. Also having right knee pain past year or so. Sees Dr Gean BirchwoodFrank Rowan for cs injxns periodically. Has new insurance which requires referral to ortho from primary care. Wants to continue to see Dr Turner Danielsowan.   Denies fevers, chills.    Review of Systems See HPI.     Objective:   Physical Exam  Constitutional:  BP 120/88 mmHg  Pulse 42  Temp(Src) 98 F (36.7 C) (Oral)  Resp 16  Ht 5' 9.5" (1.765 m)  Wt 152 lb (68.947 kg)  BMI 22.13 kg/m2  SpO2 98%   Psychiatric: He has a normal mood and affect. His speech is normal.      Assessment & Plan:   [age] yom presents needing ortho referral.   Left knee pain - Plan: Ambulatory referral to Orthopedic Surgery --referral sent  [name], PA-C Physician Assistant-Certified [facility]  02/26/2015 12:11 PM

## 2015-04-30 ENCOUNTER — Emergency Department (HOSPITAL_COMMUNITY): Payer: 59

## 2015-04-30 ENCOUNTER — Encounter (HOSPITAL_COMMUNITY): Payer: Self-pay | Admitting: *Deleted

## 2015-04-30 ENCOUNTER — Emergency Department (HOSPITAL_COMMUNITY)
Admission: EM | Admit: 2015-04-30 | Discharge: 2015-05-01 | Disposition: A | Discharge status: Home / Self Care | Payer: 59 | Attending: Emergency Medicine | Admitting: Emergency Medicine

## 2015-04-30 DIAGNOSIS — M1990 Unspecified osteoarthritis, unspecified site: Secondary | ICD-10-CM | POA: Insufficient documentation

## 2015-04-30 DIAGNOSIS — Z880 Allergy status to penicillin: Secondary | ICD-10-CM | POA: Insufficient documentation

## 2015-04-30 DIAGNOSIS — F419 Anxiety disorder, unspecified: Secondary | ICD-10-CM | POA: Insufficient documentation

## 2015-04-30 DIAGNOSIS — G43909 Migraine, unspecified, not intractable, without status migrainosus: Secondary | ICD-10-CM | POA: Insufficient documentation

## 2015-04-30 DIAGNOSIS — Z79899 Other long term (current) drug therapy: Secondary | ICD-10-CM | POA: Insufficient documentation

## 2015-04-30 DIAGNOSIS — Z87891 Personal history of nicotine dependence: Secondary | ICD-10-CM | POA: Insufficient documentation

## 2015-04-30 DIAGNOSIS — Z8619 Personal history of other infectious and parasitic diseases: Secondary | ICD-10-CM | POA: Insufficient documentation

## 2015-04-30 DIAGNOSIS — K3184 Gastroparesis: Secondary | ICD-10-CM | POA: Insufficient documentation

## 2015-04-30 DIAGNOSIS — R079 Chest pain, unspecified: Secondary | ICD-10-CM | POA: Insufficient documentation

## 2015-04-30 LAB — COMPREHENSIVE METABOLIC PANEL
ALT: 11 U/L — AB (ref 17–63)
ANION GAP: 12 (ref 5–15)
AST: 17 U/L (ref 15–41)
Albumin: 5.1 g/dL — ABNORMAL HIGH (ref 3.5–5.0)
Alkaline Phosphatase: 61 U/L (ref 38–126)
BUN: 8 mg/dL (ref 6–20)
CALCIUM: 9.8 mg/dL (ref 8.9–10.3)
CO2: 25 mmol/L (ref 22–32)
Chloride: 101 mmol/L (ref 101–111)
Creatinine, Ser: 0.91 mg/dL (ref 0.61–1.24)
GFR calc non Af Amer: >60 mL/min (ref >60)
GLUCOSE: 127 mg/dL — AB (ref 65–99)
Potassium: 3.4 mmol/L — ABNORMAL LOW (ref 3.5–5.1)
Sodium: 138 mmol/L (ref 135–145)
TOTAL PROTEIN: 8.3 g/dL — AB (ref 6.5–8.1)
Total Bilirubin: 0.8 mg/dL (ref 0.3–1.2)

## 2015-04-30 LAB — CBC WITH DIFFERENTIAL/PLATELET
BASOS ABS: 0 10*3/uL (ref 0.0–0.1)
BASOS PCT: 0 % (ref 0–1)
Eosinophils Absolute: 0 10*3/uL (ref 0.0–0.7)
Eosinophils Relative: 0 % (ref 0–5)
HCT: 44 % (ref 39.0–52.0)
HEMOGLOBIN: 15.3 g/dL (ref 13.0–17.0)
Lymphocytes Relative: 14 % (ref 12–46)
Lymphs Abs: 1.3 10*3/uL (ref 0.7–4.0)
MCH: 29.9 pg (ref 26.0–34.0)
MCHC: 34.8 g/dL (ref 30.0–36.0)
MCV: 85.9 fL (ref 78.0–100.0)
Monocytes Absolute: 0.5 10*3/uL (ref 0.1–1.0)
Monocytes Relative: 5 % (ref 3–12)
NEUTROS ABS: 7.4 10*3/uL (ref 1.7–7.7)
NEUTROS PCT: 81 % — AB (ref 43–77)
Platelets: 224 10*3/uL (ref 150–400)
RBC: 5.12 MIL/uL (ref 4.22–5.81)
RDW: 12.3 % (ref 11.5–15.5)
WBC: 9.3 10*3/uL (ref 4.0–10.5)

## 2015-04-30 LAB — LIPASE, BLOOD: LIPASE: 23 U/L (ref 22–51)

## 2015-04-30 LAB — URINALYSIS, ROUTINE W REFLEX MICROSCOPIC
Bilirubin Urine: NEGATIVE
Glucose, UA: NEGATIVE mg/dL
Hgb urine dipstick: NEGATIVE
Ketones, ur: 40 mg/dL — AB
Leukocytes, UA: NEGATIVE
NITRITE: NEGATIVE
PROTEIN: NEGATIVE mg/dL
Specific Gravity, Urine: 1.02 (ref 1.005–1.030)
UROBILINOGEN UA: 0.2 mg/dL (ref 0.0–1.0)
pH: 5.5 (ref 5.0–8.0)

## 2015-04-30 LAB — I-STAT TROPONIN, ED
Troponin i, poc: 0 ng/mL (ref 0.00–0.08)
Troponin i, poc: 0 ng/mL (ref 0.00–0.08)

## 2015-04-30 MED ORDER — FENTANYL CITRATE (PF) 100 MCG/2ML IJ SOLN
50.0000 ug | Freq: Once | INTRAMUSCULAR | Status: AC
  Administered 2015-04-30: 50 ug via INTRAVENOUS
  Filled 2015-04-30: qty 2

## 2015-04-30 MED ORDER — PROMETHAZINE HCL 25 MG/ML IJ SOLN
25.0000 mg | Freq: Once | INTRAMUSCULAR | Status: AC
  Administered 2015-04-30: 25 mg via INTRAVENOUS
  Filled 2015-04-30: qty 1

## 2015-04-30 MED ORDER — HYDROMORPHONE HCL 1 MG/ML IJ SOLN
1.0000 mg | Freq: Once | INTRAMUSCULAR | Status: AC
  Administered 2015-05-01: 1 mg via INTRAVENOUS
  Filled 2015-04-30: qty 1

## 2015-04-30 MED ORDER — IOHEXOL 300 MG/ML  SOLN
50.0000 mL | Freq: Once | INTRAMUSCULAR | Status: AC | PRN
  Administered 2015-04-30: 50 mL via ORAL

## 2015-04-30 MED ORDER — SODIUM CHLORIDE 0.9 % IV BOLUS (SEPSIS)
1000.0000 mL | Freq: Once | INTRAVENOUS | Status: AC
  Administered 2015-04-30: 1000 mL via INTRAVENOUS

## 2015-04-30 MED ORDER — LORAZEPAM 2 MG/ML IJ SOLN
1.0000 mg | Freq: Once | INTRAMUSCULAR | Status: AC
  Administered 2015-04-30: 1 mg via INTRAVENOUS
  Filled 2015-04-30: qty 1

## 2015-04-30 MED ORDER — IOHEXOL 300 MG/ML  SOLN
100.0000 mL | Freq: Once | INTRAMUSCULAR | Status: AC | PRN
  Administered 2015-04-30: 100 mL via INTRAVENOUS

## 2015-04-30 NOTE — ED Notes (Signed)
Pt reports hx of gastroparesis and Tourettes. Pt reports vomiting and similar gastroparesis symptoms x2 days, reports chest pain x1 day. Pain 10/10.

## 2015-05-01 MED ORDER — SODIUM CHLORIDE 0.9 % IV BOLUS (SEPSIS)
1000.0000 mL | Freq: Once | INTRAVENOUS | Status: AC
  Administered 2015-05-01: 1000 mL via INTRAVENOUS

## 2015-05-01 MED ORDER — METOCLOPRAMIDE HCL 5 MG/ML IJ SOLN
10.0000 mg | Freq: Once | INTRAMUSCULAR | Status: AC
  Administered 2015-05-01: 10 mg via INTRAVENOUS
  Filled 2015-05-01: qty 2

## 2015-05-01 NOTE — ED Provider Notes (Signed)
CSN: [id number]     Arrival date & time 04/30/15  1814 History   First MD Initiated Contact with Patient 04/30/15 2110     Chief Complaint  Patient presents with  . Chest Pain  . Emesis  . Abdominal Pain     (Consider location/radiation/quality/duration/timing/severity/associated sxs/prior Treatment) HPI Patient presents to the emergency department with generalized abdominal pain with a history of gastroparesis.  The patient states that he has had episodes similar to this in the past with his gastroparesis.  The patient states that he has had vomiting and nausea for the last 2 days along with the pain hits the feels like there is no pain into the chest area.  Patient states that his GI doctor advised him to come to the emergency department.  Patient states that he does not have any headache, blurred vision, weakness, dizziness, lightheadedness, fever, cough, rash, back pain, neck pain, dysuria, incontinence, hematuria, hematemesis, bloody stool or syncope.  Patient states nothing seems make his condition, better or worse Past Medical History  Diagnosis Date  . Anxiety   . Allergy   . Arthritis   . Migraine headache   . Tourettes disease   . Gastroparesis   . Internal hemorrhoids   . Helicobacter pylori gastritis    Past Surgical History  Procedure Laterality Date  . Knee arthroscopy Left 11/2009  . Colonoscopy N/A 08/11/2014    Procedure: COLONOSCOPY;  Surgeon: Meryl DareMalcolm T Stark, MD;  Location: [facility];  Service: Endoscopy;  Laterality: N/A;  . Esophagogastroduodenoscopy (egd) with propofol N/A 10/24/2014    Procedure: ESOPHAGOGASTRODUODENOSCOPY (EGD) WITH PROPOFOL;  Surgeon: Louis Meckelobert D Kaplan, MD;  Location: [facility];  Service: Endoscopy;  Laterality: N/A;   Family History  Problem Relation Age of Onset  . Diabetes Father   . Diabetes Paternal Grandfather   . Diabetes Maternal Grandfather   . Colon cancer Neg Hx   . Esophageal cancer Neg Hx   . Stomach cancer Neg Hx   .  Rectal cancer Neg Hx    History  Substance Use Topics  . Smoking status: Former Smoker    Types: Cigarettes    Quit date: 04/10/2009  . Smokeless tobacco: Never Used  . Alcohol Use: No    Review of Systems  All other systems negative except as documented in the HPI. All pertinent positives and negatives as reviewed in the HPI.  Allergies  Penicillins  Home Medications   Prior to Admission medications   Medication Sig Start Date End Date Taking? Authorizing Provider  alprazolam Prudy Feeler(XANAX) 2 MG tablet Take 1-2 mg by mouth 2 (two) times daily.    Yes Historical Provider, MD  dicyclomine (BENTYL) 20 MG tablet Take 1 tablet (20 mg total) by mouth 3 (three) times daily as needed for spasms. 12/11/14  Yes Beverley FiedlerJay M Pyrtle, MD  metoCLOPramide (REGLAN) 10 MG tablet Take 1 tablet (10 mg total) by mouth 4 (four) times daily -  before meals and at bedtime. 10/25/14  Yes Clydia LlanoMutaz Elmahi, MD  montelukast (SINGULAIR) 10 MG tablet Take 1 tablet (10 mg total) by mouth at bedtime. 10/06/14  Yes Calvert CantorSaima Rizwan, MD  omeprazole (PRILOSEC) 20 MG capsule Take 2 capsules (40 mg total) by mouth 2 (two) times daily before a meal. 10/06/14  Yes Calvert CantorSaima Rizwan, MD  rizatriptan (MAXALT) 10 MG tablet Take 1 tablet (10 mg total) by mouth as needed for migraine (migraine). May repeat in 2 hours if needed 10/06/14  Yes Calvert CantorSaima Rizwan, MD  feeding supplement, ENSURE  COMPLETE, (ENSURE COMPLETE) LIQD Take 237 mLs by mouth 3 (three) times daily between meals. Patient not taking: Reported on 02/26/2015 10/06/14   [name], MD  oxyCODONE (ROXICODONE) 15 MG immediate release tablet Take 1 tablet (15 mg total) by mouth every 4 (four) hours as needed for pain. Patient not taking: Reported on 02/26/2015 10/25/14   Clydia Llano, MD  zolpidem (AMBIEN) 10 MG tablet Take 1 tablet (10 mg total) by mouth at bedtime as needed for sleep. 10/06/14 11/05/14  [name], MD   BP 129/69 mmHg  Pulse 56  Temp(Src) 98.2 F (36.8 C) (Oral)  Resp 18  SpO2  99% Physical Exam  Constitutional: He is oriented to person, place, and time. He appears well-developed and well-nourished. No distress.  HENT:  Head: Normocephalic and atraumatic.  Mouth/Throat: Oropharynx is clear and moist.  Eyes: Pupils are equal, round, and reactive to light.  Neck: Normal range of motion. Neck supple.  Cardiovascular: Normal rate, regular rhythm and normal heart sounds.  Exam reveals no gallop and no friction rub.   No murmur heard. Pulmonary/Chest: Effort normal and breath sounds normal. No respiratory distress.  Abdominal: Soft. Bowel sounds are normal. He exhibits no distension. There is tenderness. There is no rebound and no guarding.  Neurological: He is alert and oriented to person, place, and time. He exhibits normal muscle tone. Coordination normal.  Skin: Skin is warm and dry. No rash noted. No erythema.  Psychiatric: He has a normal mood and affect. His behavior is normal.  Nursing note and vitals reviewed.   ED Course  Procedures (including critical care time) Labs Review Labs Reviewed  CBC WITH DIFFERENTIAL/PLATELET - Abnormal; Notable for the following:    Neutrophils Relative % 81 (*)    All other components within normal limits  COMPREHENSIVE METABOLIC PANEL - Abnormal; Notable for the following:    Potassium 3.4 (*)    Glucose, Bld 127 (*)    Total Protein 8.3 (*)    Albumin 5.1 (*)    ALT 11 (*)    All other components within normal limits  URINALYSIS, ROUTINE W REFLEX MICROSCOPIC (NOT AT [facility]) - Abnormal; Notable for the following:    APPearance CLOUDY (*)    Ketones, ur 40 (*)    All other components within normal limits  LIPASE, BLOOD  I-STAT TROPOININ, ED  I-STAT TROPOININ, ED    Imaging Review Ct Abdomen Pelvis W Contrast  05/01/2015   CLINICAL DATA:  Vomiting and chest pain, duration 1 day. History of gastroparesis.  EXAM: CT ABDOMEN AND PELVIS WITH CONTRAST  TECHNIQUE: Multidetector CT imaging of the abdomen and pelvis was  performed using the standard protocol following bolus administration of intravenous contrast.  CONTRAST:  50mL OMNIPAQUE IOHEXOL 300 MG/ML SOLN, OMNIPAQUE IOHEXOL 300 MG/ML SOLN  COMPARISON:  08/09/2014  FINDINGS: Lower chest:  Normal  Hepatobiliary: There are normal appearances of the liver, gallbladder and bile ducts.  Pancreas: Normal  Spleen: Normal  Adrenals/Urinary Tract: The adrenals and kidneys are normal in appearance. There is no urinary calculus evident. There is no hydronephrosis or ureteral dilatation. Collecting systems and ureters appear unremarkable.  Stomach/Bowel: The stomach is nearly empty and appears unremarkable. Small bowel is normal. Colon is unremarkable.  Vascular/Lymphatic: The abdominal aorta is normal in caliber. There is no atherosclerotic calcification. There is no adenopathy in the abdomen or pelvis.  Reproductive: Unremarkable  Other: No acute inflammatory changes are evident. There is no ascites.  Musculoskeletal: No significant abnormality. Mild  degenerative disc changes are present at L1-2. There is a small fat containing umbilical hernia.  IMPRESSION: No acute findings. The stomach is nearly empty, grossly unremarkable. No bowel obstruction, perforation or focal inflammatory changes.   Electronically Signed   By: Ellery Plunk M.D.   On: 05/01/2015 00:37     EKG Interpretation   Date/Time:  Monday April 30 2015 18:19:11 EDT Ventricular Rate:  90 PR Interval:  112 QRS Duration: 101 QT Interval:  468 QTC Calculation: 573 R Axis:   94 Text Interpretation:  Sinus rhythm Borderline short PR interval Borderline  right axis deviation Abnormal T, probable ischemia, inferior leads  Prolonged QT interval Since last tracing ischemic abnormality is more  pronounced and QT is longer with faster rate Confirmed by Effie Shy  MD,  ELLIOTT [phone]) on 04/30/2015 6:24:01 PM      The patient states that he has had complete resolution of his symptoms.  I rechecked the patient  3.  He states the Dilaudid and the nausea medicine, have completely resolved all of his symptoms.  He states he would like to go home at this time.  I have advised the patient to follow-up with his primary doctor along with his GI specialist.  Told to return here as needed  Charlestine Night, PA-C 05/04/15 1347  Mancel Bale, MD 05/05/15 [phone]

## 2015-05-01 NOTE — Discharge Instructions (Signed)
Return here as needed.  Follow-up with your GI doctor °
